# Patient Record
Sex: Female | Born: 1990
Health system: Southern US, Community
[De-identification: ages and names within clinical notes are randomized; demographics above are authoritative.]

## PROBLEM LIST (undated history)

## (undated) ENCOUNTER — Emergency Department (HOSPITAL_BASED_OUTPATIENT_CLINIC_OR_DEPARTMENT_OTHER): Admission: EM | Payer: No Typology Code available for payment source

---

## 2016-06-16 ENCOUNTER — Emergency Department (HOSPITAL_BASED_OUTPATIENT_CLINIC_OR_DEPARTMENT_OTHER)
Admission: EM | Admit: 2016-06-16 | Discharge: 2016-06-16 | Disposition: A | Discharge status: Home / Self Care | Payer: Medicaid Other | Attending: Emergency Medicine | Admitting: Emergency Medicine

## 2016-06-16 ENCOUNTER — Encounter (HOSPITAL_BASED_OUTPATIENT_CLINIC_OR_DEPARTMENT_OTHER): Payer: Self-pay | Admitting: Emergency Medicine

## 2016-06-16 DIAGNOSIS — J029 Acute pharyngitis, unspecified: Secondary | ICD-10-CM | POA: Diagnosis not present

## 2016-06-16 DIAGNOSIS — F17210 Nicotine dependence, cigarettes, uncomplicated: Secondary | ICD-10-CM | POA: Insufficient documentation

## 2016-06-16 DIAGNOSIS — R0981 Nasal congestion: Secondary | ICD-10-CM | POA: Diagnosis present

## 2016-06-16 LAB — RAPID STREP SCREEN (MED CTR MEBANE ONLY): Streptococcus, Group A Screen (Direct): NEGATIVE

## 2016-06-16 MED ORDER — FLUTICASONE PROPIONATE 50 MCG/ACT NA SUSP: 1.0000 | Freq: Every day | NASAL | 0 refills | Status: AC

## 2016-06-16 MED ORDER — CETIRIZINE-PSEUDOEPHEDRINE ER 5-120 MG PO TB12: 1.0000 | ORAL_TABLET | Freq: Every day | ORAL | 0 refills | Status: AC

## 2016-06-16 NOTE — ED Notes (Signed)
Pt given d/c instructions as per chart. Rx x 2. Verbalizes understanding. No questions. 

## 2016-06-16 NOTE — Discharge Instructions (Signed)
Use ibuprofen and Tylenol as needed for pain and fever. Warm salt water gargles, tea, honey, and over-the-counter lozenges or lollipops or sprays can be helpful for your discomfort. You may take Zyrtec and Flonase for your nasal congestion. Follow-up with primary care for reevaluation. Return to the ED if you develop any concerning symptoms.

## 2016-06-16 NOTE — ED Provider Notes (Signed)
[facility] Provider Note   CSN: [id number] Arrival date & time: 06/16/16  1741  By signing my name below, I, Megan Walters, attest that this documentation has been prepared under the direction and in the presence of [facility] PA-C. Electronically Signed: Cynda Walters, Scribe. 06/16/16. 6:55 PM.  History   Chief Complaint Chief Complaint  Patient presents with  . Sore Throat    HPI Comments: Megan Walters is a 26 y.o. female with no apparent medical history, who presents to the Emergency Department complaining of a persistent sore throat that began 3 days ago. Patient states her pain is gradually worsening. Patient states she feels as if she has strep throat. Patient reports associated nasal congestion, poor appetite, trouble swallowing, and a cough. Patient has not tried anything for her symptoms.  No modifying factors indicated. Good fluid intake. Patient denies any fever, chills, nausea, vomiting, constipation, diarrhea, SOB, chest pain, or ear pain.   The history is provided by the patient. No language interpreter was used.    History reviewed. No pertinent past medical history.  There are no active problems to display for this patient.   History reviewed. No pertinent surgical history.  OB History    No data available       Home Medications    Prior to Admission medications   Medication Sig Start Date End Date Taking? Authorizing Provider  cetirizine-pseudoephedrine (ZYRTEC-D) 5-120 MG tablet Take 1 tablet by mouth daily. 06/16/16 06/26/16  Trejuan Matherne A Lorraine Terriquez, PA-C  fluticasone (FLONASE) 50 MCG/ACT nasal spray Place 1 spray into both nostrils daily. 06/16/16   Jeanie Sewer, PA-C    Family History History reviewed. No pertinent family history.  Social History Social History  Substance Use Topics  . Smoking status: Current Every Day Smoker    Packs/day: 0.50    Types: Cigarettes  . Smokeless tobacco: Never Used  . Alcohol use No     Allergies   Patient  has no known allergies.   Review of Systems Review of Systems  Constitutional: Negative for chills and fever.  HENT: Positive for congestion, sore throat and trouble swallowing. Negative for ear pain.   Respiratory: Positive for cough. Negative for shortness of breath.   Cardiovascular: Negative for chest pain.  Gastrointestinal: Negative for constipation, diarrhea, nausea and vomiting.  All other systems reviewed and are negative.    Physical Exam Updated Vital Signs BP 126/81 (BP Location: Right Arm)   Pulse 85   Temp 99.5 F (37.5 C) (Oral)   Resp 16   Ht  (1.626 m)   Wt 72.6 kg   LMP 06/13/2016 (Exact Date)   SpO2 100%   BMI 27.46 kg/m   Physical Exam  Constitutional: She is oriented to person, place, and time. She appears well-developed and well-nourished.  HENT:  Head: Normocephalic and atraumatic.  Right Ear: External ear normal.  Left Ear: External ear normal.  Mouth/Throat: No oropharyngeal exudate.  TMs normal bilaterally. Nasal septum is midline with pink nasal mucosa and yellow-white nasal discharge. No TTP of maxillary or frontal sinuses. Posterior oropharynx with erythema and tonsillar hypertrophy; no exudates or uvular deviation.   Eyes: Conjunctivae and EOM are normal. Pupils are equal, round, and reactive to light. Right eye exhibits no discharge. Left eye exhibits no discharge. No scleral icterus.  Neck: Normal range of motion. Neck supple. No JVD present. No tracheal deviation present. No thyromegaly present.  Bilateral anterior cervical LAD  Cardiovascular: Normal rate, regular rhythm, normal heart  sounds and intact distal pulses.   Pulmonary/Chest: Effort normal and breath sounds normal.  Abdominal: Soft. She exhibits no distension. There is no tenderness.  Musculoskeletal: Normal range of motion. She exhibits no edema.  Lymphadenopathy:    She has cervical adenopathy.  Neurological: She is alert and oriented to person, place, and time.  Skin:  Skin is warm and dry. She is not diaphoretic.  Psychiatric: She has a normal mood and affect. Her behavior is normal.  Nursing note and vitals reviewed.    ED Treatments / Results  DIAGNOSTIC STUDIES: Oxygen Saturation is 100% on RA, normal by my interpretation.    COORDINATION OF CARE: 6:55 PM Discussed treatment plan with pt at bedside and pt agreed to plan, which includes tylenol and ibuprofen.   Labs (all labs ordered are listed, but only abnormal results are displayed) Labs Reviewed  RAPID STREP SCREEN (NOT AT [facility])  CULTURE, GROUP A STREP [facility])    EKG  EKG Interpretation None       Radiology No results found.  Procedures Procedures (including critical care time)  Medications Ordered in ED Medications - No data to display   Initial Impression / Assessment and Plan / ED Course  I have reviewed the triage vital signs and the nursing notes.  Pertinent labs & imaging results that were available during my care of the patient were reviewed by me and considered in my medical decision making (see chart for details).     Patient with sore throat and nasal congestion for 3 days. Patient afebrile, vital signs stable. Rapid strep negative. Sent for culture. Likely viral pharyngitis due to presence of cough and lack of exudates. Presentation non concerning for PTA or infxn spread to soft tissue. No trismus or uvula deviation. Discussed use of NSAIDs or Tylenol for comfort and if fever develops. Recommend warm salt water gargles, hot tea, honey, and over-the-counter lozenges or sprays. Antihistamine and intranasal corticosteroid for congestion. Rx for the aforementioned given. Specific return precautions discussed. Pt able to drink water in ED without difficulty with intact air way. Recommended PCP follow up. Pt verbalized understanding of and agreement with plan and is safe for discharge home at this time.   Final Clinical Impressions(s) / ED Diagnoses   Final diagnoses:    Acute pharyngitis, unspecified etiology  Mild nasal congestion    New Prescriptions Discharge Medication List as of 06/16/2016  7:01 PM    START taking these medications   Details  cetirizine-pseudoephedrine (ZYRTEC-D) 5-120 MG tablet Take 1 tablet by mouth daily., Starting Sun 06/16/2016, Until Wed 06/26/2016, Print    fluticasone (FLONASE) 50 MCG/ACT nasal spray Place 1 spray into both nostrils daily., Starting Sun 06/16/2016, Print       I personally performed the services described in this documentation, which was scribed in my presence. The recorded information has been reviewed and is accurate.     Jeanie Sewer, PA-C 06/17/16 1216    Gwyneth Sprout, MD 06/19/16 1109

## 2016-06-16 NOTE — ED Triage Notes (Signed)
Patient reports "I think I have strep throat". Reports pain when swallowing since Friday.  Denies fevers.

## 2016-06-18 LAB — CULTURE, GROUP A STREP (THRC)

## 2017-05-02 ENCOUNTER — Encounter (HOSPITAL_BASED_OUTPATIENT_CLINIC_OR_DEPARTMENT_OTHER): Payer: Self-pay

## 2017-05-02 ENCOUNTER — Other Ambulatory Visit: Payer: Self-pay

## 2017-05-02 DIAGNOSIS — Z5321 Procedure and treatment not carried out due to patient leaving prior to being seen by health care provider: Secondary | ICD-10-CM | POA: Insufficient documentation

## 2017-05-02 DIAGNOSIS — M791 Myalgia, unspecified site: Secondary | ICD-10-CM | POA: Insufficient documentation

## 2017-05-02 NOTE — ED Triage Notes (Addendum)
Pt fell down a flight of stairs yesterday, c/o pain to multiple parts of her body, in obvious pain with trying to sit and change position, took tylenol today without relief

## 2017-05-03 ENCOUNTER — Emergency Department (HOSPITAL_BASED_OUTPATIENT_CLINIC_OR_DEPARTMENT_OTHER)
Admission: EM | Admit: 2017-05-03 | Discharge: 2017-05-03 | Disposition: A | Discharge status: Home / Self Care | Payer: Medicaid Other | Attending: Emergency Medicine | Admitting: Emergency Medicine

## 2017-05-03 NOTE — ED Notes (Signed)
Pt called to treatment room with no answer from lobby.  

## 2017-05-03 NOTE — ED Notes (Signed)
Called to treatment room for 2nd time with no answer from lobby 

## 2017-06-16 ENCOUNTER — Emergency Department (HOSPITAL_BASED_OUTPATIENT_CLINIC_OR_DEPARTMENT_OTHER)
Admission: EM | Admit: 2017-06-16 | Discharge: 2017-06-16 | Disposition: A | Discharge status: Home / Self Care | Payer: No Typology Code available for payment source | Attending: Emergency Medicine | Admitting: Emergency Medicine

## 2017-06-16 ENCOUNTER — Encounter (HOSPITAL_BASED_OUTPATIENT_CLINIC_OR_DEPARTMENT_OTHER): Payer: Self-pay

## 2017-06-16 ENCOUNTER — Other Ambulatory Visit: Payer: Self-pay

## 2017-06-16 DIAGNOSIS — Y9389 Activity, other specified: Secondary | ICD-10-CM | POA: Insufficient documentation

## 2017-06-16 DIAGNOSIS — F17210 Nicotine dependence, cigarettes, uncomplicated: Secondary | ICD-10-CM | POA: Insufficient documentation

## 2017-06-16 DIAGNOSIS — S161XXA Strain of muscle, fascia and tendon at neck level, initial encounter: Secondary | ICD-10-CM | POA: Diagnosis not present

## 2017-06-16 DIAGNOSIS — S199XXA Unspecified injury of neck, initial encounter: Secondary | ICD-10-CM | POA: Diagnosis present

## 2017-06-16 DIAGNOSIS — S39012A Strain of muscle, fascia and tendon of lower back, initial encounter: Secondary | ICD-10-CM | POA: Insufficient documentation

## 2017-06-16 DIAGNOSIS — Y999 Unspecified external cause status: Secondary | ICD-10-CM | POA: Diagnosis not present

## 2017-06-16 DIAGNOSIS — Y92410 Unspecified street and highway as the place of occurrence of the external cause: Secondary | ICD-10-CM | POA: Diagnosis not present

## 2017-06-16 MED ORDER — TRAMADOL HCL 50 MG PO TABS
50.0000 mg | ORAL_TABLET | Freq: Once | ORAL | Status: AC
  Administered 2017-06-16: 50 mg via ORAL
  Filled 2017-06-16: qty 1

## 2017-06-16 MED ORDER — METHOCARBAMOL 500 MG PO TABS: 1000.0000 mg | ORAL_TABLET | Freq: Four times a day (QID) | ORAL | 0 refills | Status: DC

## 2017-06-16 MED ORDER — METHOCARBAMOL 500 MG PO TABS
1000.0000 mg | ORAL_TABLET | Freq: Once | ORAL | Status: AC
  Administered 2017-06-16: 1000 mg via ORAL
  Filled 2017-06-16: qty 2
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
## 2017-06-16 NOTE — ED Notes (Signed)
Patient educated about not driving or performing other critical tasks (such as operating heavy machinery, caring for infant/toddler/child) due to sedative nature of narcotic medications received while in the ED.  Pt/caregiver verbalized understanding.   

## 2017-06-16 NOTE — ED Notes (Signed)
Pt verbalizes understanding of d/c instructions and denies any further needs at this time. 

## 2018-04-27 ENCOUNTER — Other Ambulatory Visit: Payer: Self-pay

## 2018-04-27 ENCOUNTER — Encounter (HOSPITAL_BASED_OUTPATIENT_CLINIC_OR_DEPARTMENT_OTHER): Payer: Self-pay | Admitting: *Deleted

## 2018-04-27 ENCOUNTER — Emergency Department (HOSPITAL_BASED_OUTPATIENT_CLINIC_OR_DEPARTMENT_OTHER): Payer: Medicaid Other

## 2018-04-27 ENCOUNTER — Emergency Department (HOSPITAL_BASED_OUTPATIENT_CLINIC_OR_DEPARTMENT_OTHER)
Admission: EM | Admit: 2018-04-27 | Discharge: 2018-04-27 | Disposition: A | Discharge status: Home / Self Care | Payer: Medicaid Other | Attending: Emergency Medicine | Admitting: Emergency Medicine

## 2018-04-27 DIAGNOSIS — Z3202 Encounter for pregnancy test, result negative: Secondary | ICD-10-CM | POA: Diagnosis not present

## 2018-04-27 DIAGNOSIS — Y92410 Unspecified street and highway as the place of occurrence of the external cause: Secondary | ICD-10-CM | POA: Diagnosis not present

## 2018-04-27 DIAGNOSIS — Y9389 Activity, other specified: Secondary | ICD-10-CM | POA: Insufficient documentation

## 2018-04-27 DIAGNOSIS — S199XXA Unspecified injury of neck, initial encounter: Secondary | ICD-10-CM | POA: Diagnosis not present

## 2018-04-27 DIAGNOSIS — S0990XA Unspecified injury of head, initial encounter: Secondary | ICD-10-CM | POA: Diagnosis present

## 2018-04-27 DIAGNOSIS — M25571 Pain in right ankle and joints of right foot: Secondary | ICD-10-CM | POA: Insufficient documentation

## 2018-04-27 DIAGNOSIS — F17210 Nicotine dependence, cigarettes, uncomplicated: Secondary | ICD-10-CM | POA: Insufficient documentation

## 2018-04-27 DIAGNOSIS — Y999 Unspecified external cause status: Secondary | ICD-10-CM | POA: Diagnosis not present

## 2018-04-27 DIAGNOSIS — S0993XA Unspecified injury of face, initial encounter: Secondary | ICD-10-CM | POA: Insufficient documentation

## 2018-04-27 LAB — PREGNANCY, URINE: PREG TEST UR: NEGATIVE

## 2018-04-27 MED ORDER — METHOCARBAMOL 500 MG PO TABS: 500.0000 mg | ORAL_TABLET | Freq: Three times a day (TID) | ORAL | 0 refills | Status: DC | PRN

## 2018-04-27 MED ORDER — KETOROLAC TROMETHAMINE 30 MG/ML IJ SOLN
30.0000 mg | Freq: Once | INTRAMUSCULAR | Status: DC
  Filled 2018-04-27: qty 1

## 2018-04-27 MED ORDER — ACETAMINOPHEN 500 MG PO TABS
1000.0000 mg | ORAL_TABLET | Freq: Once | ORAL | Status: DC
  Filled 2018-04-27: qty 2

## 2018-04-27 MED FILL — METHOCARBAMOL 500 MG TABLET: 500 | 7 days supply | Qty: 20 | Fill #0
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
## 2018-04-27 NOTE — Discharge Instructions (Signed)
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
## 2018-09-06 ENCOUNTER — Encounter (HOSPITAL_BASED_OUTPATIENT_CLINIC_OR_DEPARTMENT_OTHER): Payer: Self-pay

## 2018-09-06 ENCOUNTER — Emergency Department (HOSPITAL_BASED_OUTPATIENT_CLINIC_OR_DEPARTMENT_OTHER)
Admission: EM | Admit: 2018-09-06 | Discharge: 2018-09-07 | Disposition: A | Discharge status: Home / Self Care | Payer: Medicaid Other | Attending: Emergency Medicine | Admitting: Emergency Medicine

## 2018-09-06 ENCOUNTER — Other Ambulatory Visit: Payer: Self-pay

## 2018-09-06 DIAGNOSIS — E876 Hypokalemia: Secondary | ICD-10-CM | POA: Diagnosis not present

## 2018-09-06 DIAGNOSIS — K8020 Calculus of gallbladder without cholecystitis without obstruction: Secondary | ICD-10-CM | POA: Diagnosis not present

## 2018-09-06 DIAGNOSIS — R1032 Left lower quadrant pain: Secondary | ICD-10-CM | POA: Insufficient documentation

## 2018-09-06 DIAGNOSIS — Z79899 Other long term (current) drug therapy: Secondary | ICD-10-CM | POA: Insufficient documentation

## 2018-09-06 DIAGNOSIS — K529 Noninfective gastroenteritis and colitis, unspecified: Secondary | ICD-10-CM | POA: Insufficient documentation

## 2018-09-06 DIAGNOSIS — R1012 Left upper quadrant pain: Secondary | ICD-10-CM | POA: Insufficient documentation

## 2018-09-06 DIAGNOSIS — R109 Unspecified abdominal pain: Secondary | ICD-10-CM

## 2018-09-06 DIAGNOSIS — F17210 Nicotine dependence, cigarettes, uncomplicated: Secondary | ICD-10-CM | POA: Insufficient documentation

## 2018-09-06 LAB — CBC
HCT: 42 % (ref 36.0–46.0)
Hemoglobin: 13.9 g/dL (ref 12.0–15.0)
MCH: 30.7 pg (ref 26.0–34.0)
MCHC: 33.1 g/dL (ref 30.0–36.0)
MCV: 92.7 fL (ref 80.0–100.0)
Platelets: 216 10*3/uL (ref 150–400)
RBC: 4.53 MIL/uL (ref 3.87–5.11)
RDW: 13.3 % (ref 11.5–15.5)
WBC: 5.6 10*3/uL (ref 4.0–10.5)
nRBC: 0 % (ref 0.0–0.2)

## 2018-09-06 LAB — URINALYSIS, ROUTINE W REFLEX MICROSCOPIC
Glucose, UA: NEGATIVE mg/dL
Ketones, ur: 15 mg/dL — AB
Leukocytes,Ua: NEGATIVE
Nitrite: NEGATIVE
Protein, ur: 100 mg/dL — AB
Specific Gravity, Urine: 1.02 (ref 1.005–1.030)
pH: 6.5 (ref 5.0–8.0)

## 2018-09-06 LAB — URINALYSIS, MICROSCOPIC (REFLEX)

## 2018-09-06 LAB — PREGNANCY, URINE: Preg Test, Ur: NEGATIVE

## 2018-09-06 MED ORDER — MORPHINE SULFATE (PF) 4 MG/ML IV SOLN
4.0000 mg | Freq: Once | INTRAVENOUS | Status: AC
  Administered 2018-09-06: 4 mg via INTRAVENOUS
  Filled 2018-09-06: qty 1

## 2018-09-06 MED ORDER — IOHEXOL 300 MG/ML  SOLN
100.0000 mL | Freq: Once | INTRAMUSCULAR | Status: AC
  Administered 2018-09-07: 100 mL via INTRAVENOUS

## 2018-09-06 MED ORDER — SODIUM CHLORIDE 0.9 % IV BOLUS
1000.0000 mL | Freq: Once | INTRAVENOUS | Status: AC
  Administered 2018-09-06: 1000 mL via INTRAVENOUS

## 2018-09-06 MED ORDER — ONDANSETRON HCL 4 MG/2ML IJ SOLN
4.0000 mg | Freq: Once | INTRAMUSCULAR | Status: AC
  Administered 2018-09-06: 4 mg via INTRAVENOUS
  Filled 2018-09-06: qty 2

## 2018-09-06 NOTE — ED Provider Notes (Signed)
[facility] EMERGENCY DEPARTMENT Provider Note   CSN: [id number] Arrival date & time: 09/06/18  2210     History   Chief Complaint Chief Complaint  Patient presents with   Abdominal Pain    HPI Megan Walters is a 28 y.o. female.     Patient c/o left abd pain for the past 3-4 days. Symptoms acute onset, mod-severe, constant, persistent, non radiating, without specific exacerbating or allev factors. No hx same pain. No hematuria or dysuria. No vaginal bleeding or discharge. lnmp 1 week ago. Denies hx ovarian cyst or endometriosis. No constipation. No nvd. Back or posterior flank pain. Normal appetite. No fever or chills. No known covid + exposure. Denies cough or sob. No chest pain.   The history is provided by the patient.  Abdominal Pain Associated symptoms: no chest pain, no chills, no constipation, no diarrhea, no dysuria, no fever, no shortness of breath, no sore throat, no vaginal bleeding, no vaginal discharge and no vomiting     History reviewed. No pertinent past medical history.  There are no active problems to display for this patient.   History reviewed. No pertinent surgical history.   OB History   No obstetric history on file.      Home Medications    Prior to Admission medications   Medication Sig Start Date End Date Taking? Authorizing Provider  fluticasone (FLONASE) 50 MCG/ACT nasal spray Place 1 spray into both nostrils daily. 06/16/16   Fawze, Mina A, PA-C  methocarbamol (ROBAXIN) 500 MG tablet Take 1 tablet (500 mg total) by mouth every 8 (eight) hours as needed for muscle spasms. 04/27/18   Long, Arlyss RepressJoshua G, MD    Family History No family history on file.  Social History Social History   Tobacco Use   Smoking status: Current Every Day Smoker    Packs/day: 0.50    Types: Cigarettes   Smokeless tobacco: Never Used  Substance Use Topics   Alcohol use: Yes   Drug use: No     Allergies   Ibuprofen   Review of Systems Review  of Systems  Constitutional: Negative for chills and fever.  HENT: Negative for sore throat.   Eyes: Negative for redness.  Respiratory: Negative for shortness of breath.   Cardiovascular: Negative for chest pain.  Gastrointestinal: Positive for abdominal pain. Negative for constipation, diarrhea and vomiting.  Endocrine: Negative for polyuria.  Genitourinary: Negative for dysuria, flank pain, vaginal bleeding and vaginal discharge.  Musculoskeletal: Negative for back pain and neck pain.  Skin: Negative for rash.  Neurological: Negative for headaches.  Hematological: Does not bruise/bleed easily.  Psychiatric/Behavioral: Negative for confusion.     Physical Exam Updated Vital Signs BP 110/83 (BP Location: Left Arm)    Pulse 76    Temp 98.3 F (36.8 C) (Oral)    Resp 16    Ht 1.626 m (5\' 4" )    Wt 77.6 kg    LMP 08/29/2018 (LMP Unknown)    SpO2 100%    BMI 29.35 kg/m   Physical Exam Vitals signs and nursing note reviewed.  Constitutional:      Appearance: Normal appearance. She is well-developed.  HENT:     Head: Atraumatic.     Nose: Nose normal.     Mouth/Throat:     Mouth: Mucous membranes are moist.  Eyes:     General: No scleral icterus.    Conjunctiva/sclera: Conjunctivae normal.  Neck:     Musculoskeletal: Normal range of motion and neck supple.  No neck rigidity or muscular tenderness.     Trachea: No tracheal deviation.  Cardiovascular:     Rate and Rhythm: Normal rate and regular rhythm.     Pulses: Normal pulses.     Heart sounds: Normal heart sounds. No murmur. No friction rub. No gallop.   Pulmonary:     Effort: Pulmonary effort is normal. No respiratory distress.     Breath sounds: Normal breath sounds.  Abdominal:     General: Bowel sounds are normal. There is no distension.     Palpations: Abdomen is soft. There is no mass.     Tenderness: There is abdominal tenderness. There is no guarding or rebound.     Hernia: No hernia is present.     Comments:  Moderate left abd tenderness.   Genitourinary:    Comments: No cva tenderness. Mild whitish vaginal discharge. Mild cmt. No adx masses/focal tenderness.  Musculoskeletal:        General: No swelling.  Skin:    General: Skin is warm and dry.     Findings: No rash.  Neurological:     Mental Status: She is alert.     Comments: Alert, speech normal.   Psychiatric:        Mood and Affect: Mood normal.      ED Treatments / Results  Labs (all labs ordered are listed, but only abnormal results are displayed) Results for orders placed or performed during the hospital encounter of 09/06/18  Wet prep, genital   Specimen: Vaginal; Genital  Result Value Ref Range   Yeast Wet Prep HPF POC NONE SEEN NONE SEEN   Trich, Wet Prep NONE SEEN NONE SEEN   Clue Cells Wet Prep HPF POC PRESENT (A) NONE SEEN   WBC, Wet Prep HPF POC MODERATE (A) NONE SEEN   Sperm NONE SEEN   Pregnancy, urine  Result Value Ref Range   Preg Test, Ur NEGATIVE NEGATIVE  Urinalysis, Routine w reflex microscopic  Result Value Ref Range   Color, Urine YELLOW YELLOW   APPearance HAZY (A) CLEAR   Specific Gravity, Urine 1.020 1.005 - 1.030   pH 6.5 5.0 - 8.0   Glucose, UA NEGATIVE NEGATIVE mg/dL   Hgb urine dipstick TRACE (A) NEGATIVE   Bilirubin Urine SMALL (A) NEGATIVE   Ketones, ur 15 (A) NEGATIVE mg/dL   Protein, ur 960100 (A) NEGATIVE mg/dL   Nitrite NEGATIVE NEGATIVE   Leukocytes,Ua NEGATIVE NEGATIVE  Urinalysis, Microscopic (reflex)  Result Value Ref Range   RBC / HPF 0-5 0 - 5 RBC/hpf   WBC, UA 0-5 0 - 5 WBC/hpf   Bacteria, UA FEW (A) NONE SEEN   Squamous Epithelial / LPF 0-5 0 - 5  Comprehensive metabolic panel  Result Value Ref Range   Sodium 137 135 - 145 mmol/L   Potassium 2.8 (L) 3.5 - 5.1 mmol/L   Chloride 99 98 - 111 mmol/L   CO2 24 22 - 32 mmol/L   Glucose, Bld 87 70 - 99 mg/dL   BUN 6 6 - 20 mg/dL   Creatinine, Ser 4.540.57 0.44 - 1.00 mg/dL   Calcium 9.1 8.9 - 09.810.3 mg/dL   Total Protein 8.1 6.5 -  8.1 g/dL   Albumin 3.7 3.5 - 5.0 g/dL   AST 29 15 - 41 U/L   ALT 19 0 - 44 U/L   Alkaline Phosphatase 59 38 - 126 U/L   Total Bilirubin 0.8 0.3 - 1.2 mg/dL   GFR calc non Af Amer >  60 >60 mL/min   GFR calc Af Amer >60 >60 mL/min   Anion gap 14 5 - 15  CBC  Result Value Ref Range   WBC 5.6 4.0 - 10.5 K/uL   RBC 4.53 3.87 - 5.11 MIL/uL   Hemoglobin 13.9 12.0 - 15.0 g/dL   HCT 69.642.0 29.536.0 - 28.446.0 %   MCV 92.7 80.0 - 100.0 fL   MCH 30.7 26.0 - 34.0 pg   MCHC 33.1 30.0 - 36.0 g/dL   RDW 13.213.3 44.011.5 - 10.215.5 %   Platelets 216 150 - 400 K/uL   nRBC 0.0 0.0 - 0.2 %  Lipase, blood  Result Value Ref Range   Lipase 21 11 - 51 U/L   Ct Abdomen Pelvis W Contrast  Result Date: 09/07/2018 CLINICAL DATA:  28 year old female with generalized abdominal pain. EXAM: CT ABDOMEN AND PELVIS WITH CONTRAST TECHNIQUE: Multidetector CT imaging of the abdomen and pelvis was performed using the standard protocol following bolus administration of intravenous contrast. CONTRAST:  100mL OMNIPAQUE IOHEXOL 300 MG/ML  SOLN COMPARISON:  None. FINDINGS: Lower chest: The visualized lung bases are clear. No intra-abdominal free air or free fluid. Hepatobiliary: Apparent diffuse fatty infiltration of the liver. No intrahepatic biliary ductal dilatation. Noncalcified stones containing gas noted in the gallbladder. No pericholecystic fluid. Ultrasound may provide better evaluation of the gallbladder if clinically indicated. Pancreas: Unremarkable. No pancreatic ductal dilatation or surrounding inflammatory changes. Spleen: Normal in size without focal abnormality. Adrenals/Urinary Tract: Adrenal glands are unremarkable. Kidneys are normal, without renal calculi, focal lesion, or hydronephrosis. Bladder is unremarkable. Stomach/Bowel: There is diffuse thickened appearance of the small bowel loops with edema in the small bowel mesentery most consistent with enteritis. Clinical correlation is recommended. There is no bowel obstruction. Loose  stool noted throughout the colon compatible with diarrheal state. The appendix is normal. Vascular/Lymphatic: The abdominal aorta and IVC are unremarkable. No portal venous gas. There is no adenopathy. Reproductive: The uterus is anteverted and grossly unremarkable. No adnexal masses identified. Other: None Musculoskeletal: No acute or significant osseous findings. IMPRESSION: 1. Enteritis with diarrheal state. Correlation with clinical exam and stool cultures recommended. No bowel obstruction. Normal appendix. 2. Fatty liver. 3. Cholelithiasis. Ultrasound may provide better evaluation of the gallbladder if clinically indicated. Electronically Signed   By: Elgie CollardArash  Radparvar M.D.   On: 09/07/2018 00:54    EKG None  Radiology Ct Abdomen Pelvis W Contrast  Result Date: 09/07/2018 CLINICAL DATA:  28 year old female with generalized abdominal pain. EXAM: CT ABDOMEN AND PELVIS WITH CONTRAST TECHNIQUE: Multidetector CT imaging of the abdomen and pelvis was performed using the standard protocol following bolus administration of intravenous contrast. CONTRAST:  100mL OMNIPAQUE IOHEXOL 300 MG/ML  SOLN COMPARISON:  None. FINDINGS: Lower chest: The visualized lung bases are clear. No intra-abdominal free air or free fluid. Hepatobiliary: Apparent diffuse fatty infiltration of the liver. No intrahepatic biliary ductal dilatation. Noncalcified stones containing gas noted in the gallbladder. No pericholecystic fluid. Ultrasound may provide better evaluation of the gallbladder if clinically indicated. Pancreas: Unremarkable. No pancreatic ductal dilatation or surrounding inflammatory changes. Spleen: Normal in size without focal abnormality. Adrenals/Urinary Tract: Adrenal glands are unremarkable. Kidneys are normal, without renal calculi, focal lesion, or hydronephrosis. Bladder is unremarkable. Stomach/Bowel: There is diffuse thickened appearance of the small bowel loops with edema in the small bowel mesentery most  consistent with enteritis. Clinical correlation is recommended. There is no bowel obstruction. Loose stool noted throughout the colon compatible with diarrheal state. The appendix is normal.  Vascular/Lymphatic: The abdominal aorta and IVC are unremarkable. No portal venous gas. There is no adenopathy. Reproductive: The uterus is anteverted and grossly unremarkable. No adnexal masses identified. Other: None Musculoskeletal: No acute or significant osseous findings. IMPRESSION: 1. Enteritis with diarrheal state. Correlation with clinical exam and stool cultures recommended. No bowel obstruction. Normal appendix. 2. Fatty liver. 3. Cholelithiasis. Ultrasound may provide better evaluation of the gallbladder if clinically indicated. Electronically Signed   By: Anner Crete M.D.   On: 09/07/2018 00:54    Procedures Procedures (including critical care time)  Medications Ordered in ED Medications  sodium chloride 0.9 % bolus 1,000 mL (has no administration in time range)  morphine 4 MG/ML injection 4 mg (has no administration in time range)  ondansetron (ZOFRAN) injection 4 mg (has no administration in time range)     Initial Impression / Assessment and Plan / ED Course  I have reviewed the triage vital signs and the nursing notes.  Pertinent labs & imaging results that were available during my care of the patient were reviewed by me and considered in my medical decision making (see chart for details).  Iv ns bolus. Morphine iv. zofran iv. Labs sent.  Labs reviewed by me - preg tet neg.   CT pending.   Reviewed nursing notes and prior charts for additional history.   CT reviewed by me - gallstones, enteritis. Note that pt denies any diarrhea. Denies hx chronic or recurrent abd pain, no hx IBD. pts pain is predominantly left sided/lower - current symptoms felt not c/w acute biliary colic.  Pt requests additional pain meds - provided.   Given ct c/w 'enteritis', pain/tenderness, will tx w  abx therapy, and symptom relief/pain medication.   rx abx for home.   rec pcp f/u.  Return precautions provided.     Final Clinical Impressions(s) / ED Diagnoses   Final diagnoses:  None    ED Discharge Orders    None       Lajean Saver, MD 09/07/18 [phone]

## 2018-09-06 NOTE — ED Triage Notes (Signed)
L side abd apin x 4 days. Denies N/V/D and has normal appetite.

## 2018-09-07 ENCOUNTER — Emergency Department (HOSPITAL_BASED_OUTPATIENT_CLINIC_OR_DEPARTMENT_OTHER): Payer: Medicaid Other

## 2018-09-07 LAB — COMPREHENSIVE METABOLIC PANEL
ALT: 19 U/L (ref 0–44)
AST: 29 U/L (ref 15–41)
Albumin: 3.7 g/dL (ref 3.5–5.0)
Alkaline Phosphatase: 59 U/L (ref 38–126)
Anion gap: 14 (ref 5–15)
BUN: 6 mg/dL (ref 6–20)
CO2: 24 mmol/L (ref 22–32)
Calcium: 9.1 mg/dL (ref 8.9–10.3)
Chloride: 99 mmol/L (ref 98–111)
Creatinine, Ser: 0.57 mg/dL (ref 0.44–1.00)
GFR calc Af Amer: >60 mL/min (ref >60)
GFR calc non Af Amer: >60 mL/min (ref >60)
Glucose, Bld: 87 mg/dL (ref 70–99)
Potassium: 2.8 mmol/L — ABNORMAL LOW (ref 3.5–5.1)
Sodium: 137 mmol/L (ref 135–145)
Total Bilirubin: 0.8 mg/dL (ref 0.3–1.2)
Total Protein: 8.1 g/dL (ref 6.5–8.1)

## 2018-09-07 LAB — WET PREP, GENITAL
Sperm: NONE SEEN
Trich, Wet Prep: NONE SEEN
Yeast Wet Prep HPF POC: NONE SEEN

## 2018-09-07 LAB — LIPASE, BLOOD: Lipase: 21 U/L (ref 11–51)

## 2018-09-07 MED ORDER — MORPHINE SULFATE (PF) 4 MG/ML IV SOLN
4.0000 mg | Freq: Once | INTRAVENOUS | Status: AC
  Administered 2018-09-07: 02:00:00 4 mg via INTRAVENOUS
  Filled 2018-09-07: qty 1

## 2018-09-07 MED ORDER — PIPERACILLIN-TAZOBACTAM 3.375 G IVPB 30 MIN
3.3750 g | Freq: Once | INTRAVENOUS | Status: AC
  Administered 2018-09-07: 02:00:00 3.375 g via INTRAVENOUS
  Filled 2018-09-07 (×2): qty 50

## 2018-09-07 MED ORDER — POTASSIUM CHLORIDE CRYS ER 20 MEQ PO TBCR
40.0000 meq | EXTENDED_RELEASE_TABLET | Freq: Once | ORAL | Status: AC
  Administered 2018-09-07: 01:00:00 40 meq via ORAL
  Filled 2018-09-07: qty 2

## 2018-09-07 MED ORDER — TRAMADOL HCL 50 MG PO TABS: 50.0000 mg | ORAL_TABLET | Freq: Four times a day (QID) | ORAL | 0 refills | Status: DC | PRN

## 2018-09-07 MED ORDER — HYDROMORPHONE HCL 1 MG/ML IJ SOLN
0.5000 mg | Freq: Once | INTRAMUSCULAR | Status: AC
  Administered 2018-09-07: 0.5 mg via INTRAVENOUS
  Filled 2018-09-07: qty 1

## 2018-09-07 MED ORDER — METRONIDAZOLE 500 MG PO TABS: 500.0000 mg | ORAL_TABLET | Freq: Two times a day (BID) | ORAL | 0 refills | Status: DC

## 2018-09-07 MED ORDER — AZITHROMYCIN 250 MG PO TABS
1000.0000 mg | ORAL_TABLET | Freq: Once | ORAL | Status: AC
  Administered 2018-09-07: 1000 mg via ORAL
  Filled 2018-09-07: qty 4

## 2018-09-07 MED ORDER — DOXYCYCLINE HYCLATE 100 MG PO CAPS: 100.0000 mg | ORAL_CAPSULE | Freq: Two times a day (BID) | ORAL | 0 refills | Status: DC

## 2018-09-07 MED ORDER — POTASSIUM CHLORIDE CRYS ER 20 MEQ PO TBCR: EXTENDED_RELEASE_TABLET | ORAL | 0 refills | Status: AC

## 2018-09-07 MED ORDER — POTASSIUM CHLORIDE 10 MEQ/100ML IV SOLN
10.0000 meq | INTRAVENOUS | Status: AC
  Administered 2018-09-07 (×2): 10 meq via INTRAVENOUS
  Filled 2018-09-07 (×2): qty 100

## 2018-09-07 MED ORDER — SODIUM CHLORIDE 0.9 % IV BOLUS
1000.0000 mL | Freq: Once | INTRAVENOUS | Status: AC
  Administered 2018-09-07: 1000 mL via INTRAVENOUS

## 2018-09-07 NOTE — ED Notes (Signed)
Pt understood dc material. NAD noted. All questions answered to satisfaction. Scripts sent in electronically. Pt escorted to check out counter.

## 2018-09-07 NOTE — Discharge Instructions (Addendum)
It was our pleasure to provide your ER care today - we hope that you feel better.  Take antibiotics (doxycycline and flagyl) as prescribed. Do not drink alcohol when taking these antibiotics. Take acetaminophen as need for pain. You may also take ultram as need for pain - no driving when taking.   From today's lab tests, your potassium level is low (2.8) - eat plenty of fruits and vegetables, take potassium supplement as prescribed, and follow up with primary care doctor in 1 week.   Your ct scan was read as follows: 1. Enteritis. No bowel obstruction. Normal appendix. 2. Fatty liver. 3. Gallstones  Follow up with primary care doctor in 1 week - have them review your ct scan.  For gallstones, follow up with general surgeon in the next few weeks - call office to arrange appointment.   Return to ER if worse, new symptoms, fevers, persistent vomiting, worsening or severe pain, other concern.  You were given pain medication in the ER - no driving for the next 6 hours.

## 2018-09-08 LAB — GC/CHLAMYDIA PROBE AMP (~~LOC~~) NOT AT ARMC
Chlamydia: UNDETERMINED
Neisseria Gonorrhea: NEGATIVE

## 2019-01-24 ENCOUNTER — Emergency Department (HOSPITAL_BASED_OUTPATIENT_CLINIC_OR_DEPARTMENT_OTHER)
Admission: EM | Admit: 2019-01-24 | Discharge: 2019-01-24 | Disposition: A | Discharge status: Home / Self Care | Payer: Medicaid Other | Attending: Emergency Medicine | Admitting: Emergency Medicine

## 2019-01-24 ENCOUNTER — Emergency Department (HOSPITAL_BASED_OUTPATIENT_CLINIC_OR_DEPARTMENT_OTHER): Payer: Medicaid Other

## 2019-01-24 ENCOUNTER — Encounter (HOSPITAL_BASED_OUTPATIENT_CLINIC_OR_DEPARTMENT_OTHER): Payer: Self-pay | Admitting: *Deleted

## 2019-01-24 ENCOUNTER — Other Ambulatory Visit: Payer: Self-pay

## 2019-01-24 DIAGNOSIS — R1032 Left lower quadrant pain: Secondary | ICD-10-CM | POA: Insufficient documentation

## 2019-01-24 DIAGNOSIS — R1030 Lower abdominal pain, unspecified: Secondary | ICD-10-CM

## 2019-01-24 DIAGNOSIS — Z79899 Other long term (current) drug therapy: Secondary | ICD-10-CM | POA: Insufficient documentation

## 2019-01-24 DIAGNOSIS — Z886 Allergy status to analgesic agent status: Secondary | ICD-10-CM | POA: Diagnosis not present

## 2019-01-24 DIAGNOSIS — K529 Noninfective gastroenteritis and colitis, unspecified: Secondary | ICD-10-CM | POA: Diagnosis not present

## 2019-01-24 DIAGNOSIS — F17210 Nicotine dependence, cigarettes, uncomplicated: Secondary | ICD-10-CM | POA: Diagnosis not present

## 2019-01-24 DIAGNOSIS — R102 Pelvic and perineal pain: Secondary | ICD-10-CM

## 2019-01-24 DIAGNOSIS — R1031 Right lower quadrant pain: Secondary | ICD-10-CM | POA: Diagnosis present

## 2019-01-24 LAB — COMPREHENSIVE METABOLIC PANEL
ALT: 26 U/L (ref 0–44)
AST: 27 U/L (ref 15–41)
Albumin: 3.8 g/dL (ref 3.5–5.0)
Alkaline Phosphatase: 67 U/L (ref 38–126)
Anion gap: 8 (ref 5–15)
BUN: 10 mg/dL (ref 6–20)
CO2: 24 mmol/L (ref 22–32)
Calcium: 8.8 mg/dL — ABNORMAL LOW (ref 8.9–10.3)
Chloride: 105 mmol/L (ref 98–111)
Creatinine, Ser: 0.69 mg/dL (ref 0.44–1.00)
GFR calc Af Amer: >60 mL/min (ref >60)
GFR calc non Af Amer: >60 mL/min (ref >60)
Glucose, Bld: 92 mg/dL (ref 70–99)
Potassium: 3.7 mmol/L (ref 3.5–5.1)
Sodium: 137 mmol/L (ref 135–145)
Total Bilirubin: 0.8 mg/dL (ref 0.3–1.2)
Total Protein: 7.5 g/dL (ref 6.5–8.1)

## 2019-01-24 LAB — CBC WITH DIFFERENTIAL/PLATELET
Abs Immature Granulocytes: 0 10*3/uL (ref 0.00–0.07)
Basophils Absolute: 0 10*3/uL (ref 0.0–0.1)
Basophils Relative: 0 %
Eosinophils Absolute: 0 10*3/uL (ref 0.0–0.5)
Eosinophils Relative: 0 %
HCT: 43.9 % (ref 36.0–46.0)
Hemoglobin: 14.5 g/dL (ref 12.0–15.0)
Immature Granulocytes: 0 %
Lymphocytes Relative: 62 %
Lymphs Abs: 2.9 10*3/uL (ref 0.7–4.0)
MCH: 30.5 pg (ref 26.0–34.0)
MCHC: 33 g/dL (ref 30.0–36.0)
MCV: 92.4 fL (ref 80.0–100.0)
Monocytes Absolute: 0.5 10*3/uL (ref 0.1–1.0)
Monocytes Relative: 10 %
Neutro Abs: 1.3 10*3/uL — ABNORMAL LOW (ref 1.7–7.7)
Neutrophils Relative %: 28 %
Platelets: 183 10*3/uL (ref 150–400)
RBC: 4.75 MIL/uL (ref 3.87–5.11)
RDW: 11.9 % (ref 11.5–15.5)
WBC: 4.6 10*3/uL (ref 4.0–10.5)
nRBC: 0 % (ref 0.0–0.2)

## 2019-01-24 LAB — WET PREP, GENITAL
Clue Cells Wet Prep HPF POC: NONE SEEN
Sperm: NONE SEEN
Trich, Wet Prep: NONE SEEN
Yeast Wet Prep HPF POC: NONE SEEN

## 2019-01-24 LAB — LIPASE, BLOOD: Lipase: 35 U/L (ref 11–51)

## 2019-01-24 LAB — PREGNANCY, URINE: Preg Test, Ur: NEGATIVE

## 2019-01-24 MED ORDER — ONDANSETRON HCL 4 MG/2ML IJ SOLN
4.0000 mg | Freq: Once | INTRAMUSCULAR | Status: AC
  Administered 2019-01-24: 4 mg via INTRAVENOUS
  Filled 2019-01-24: qty 2

## 2019-01-24 MED ORDER — DICYCLOMINE HCL 20 MG PO TABS: 20.0000 mg | ORAL_TABLET | Freq: Two times a day (BID) | ORAL | 0 refills | Status: DC

## 2019-01-24 MED ORDER — CIPROFLOXACIN HCL 500 MG PO TABS: 500.0000 mg | ORAL_TABLET | Freq: Two times a day (BID) | ORAL | 0 refills | Status: DC

## 2019-01-24 MED ORDER — MORPHINE SULFATE (PF) 4 MG/ML IV SOLN
4.0000 mg | Freq: Once | INTRAVENOUS | Status: AC
  Administered 2019-01-24: 4 mg via INTRAVENOUS
  Filled 2019-01-24: qty 1

## 2019-01-24 MED ORDER — HYDROCODONE-ACETAMINOPHEN 5-325 MG PO TABS: 1.0000 | ORAL_TABLET | ORAL | 0 refills | Status: DC | PRN

## 2019-01-24 MED ORDER — DICYCLOMINE HCL 20 MG PO TABS: 20.0000 mg | ORAL_TABLET | Freq: Two times a day (BID) | ORAL | 0 refills | Status: AC

## 2019-01-24 MED ORDER — IOHEXOL 300 MG/ML  SOLN
100.0000 mL | Freq: Once | INTRAMUSCULAR | Status: AC | PRN
  Administered 2019-01-24: 100 mL via INTRAVENOUS

## 2019-01-24 MED ORDER — FENTANYL CITRATE (PF) 100 MCG/2ML IJ SOLN
25.0000 ug | Freq: Once | INTRAMUSCULAR | Status: AC
  Administered 2019-01-24: 22:00:00 25 ug via INTRAVENOUS
  Filled 2019-01-24: qty 2

## 2019-01-24 MED ORDER — SODIUM CHLORIDE 0.9 % IV BOLUS
1000.0000 mL | Freq: Once | INTRAVENOUS | Status: AC
  Administered 2019-01-24: 21:00:00 1000 mL via INTRAVENOUS

## 2019-01-24 MED ORDER — METRONIDAZOLE 500 MG PO TABS: 500.0000 mg | ORAL_TABLET | Freq: Two times a day (BID) | ORAL | 0 refills | Status: DC

## 2019-01-24 MED ORDER — HYDROCODONE-ACETAMINOPHEN 5-325 MG PO TABS: 2.0000 | ORAL_TABLET | ORAL | 0 refills | Status: DC | PRN

## 2019-01-24 MED ORDER — DICYCLOMINE HCL 10 MG PO CAPS
10.0000 mg | ORAL_CAPSULE | Freq: Once | ORAL | Status: AC
  Administered 2019-01-24: 22:00:00 10 mg via ORAL
  Filled 2019-01-24: qty 1

## 2019-01-24 NOTE — Discharge Instructions (Signed)
Take the medications as prescribed.  Follow-up with OB/GYN this week.  Return for new or worsening symptoms.

## 2019-01-24 NOTE — ED Provider Notes (Signed)
[facility] EMERGENCY DEPARTMENT Provider Note   CSN: [id number] Arrival date & time: 01/24/19  1946    History   Chief Complaint Chief Complaint  Patient presents with   Abdominal Pain    HPI Megan Walters is a 28 y.o. female with no significant past medical history who presents for evaluation of of lower abdominal pain.  Patient states she has had diffuse lower abdominal cramping.  This is intermittent.  Located to left and right lower quadrant.  She has been taking Tylenol without relief of her pain.  She denies chance of pregnancy.  LMP 3 weeks ago.  She is not currently sexually active.  She denies fever, chills, nausea, vomiting, chest pain, shortness of breath, dysuria, vaginal discharge, concerns for STDs, dysuria, diarrhea or constipation.  Her last bowel movement was yesterday without melena or bright red blood per rectum.  No known Covid exposures.  Denies additional aggravating or alleviating factors.  She rates her current pain a 9/10.  History obtained from patient and past medical records.  No interpreter is used.     HPI  History reviewed. No pertinent past medical history.  There are no active problems to display for this patient.   History reviewed. No pertinent surgical history.   OB History   No obstetric history on file.      Home Medications    Prior to Admission medications   Medication Sig Start Date End Date Taking? Authorizing Provider  ciprofloxacin (CIPRO) 500 MG tablet Take 1 tablet (500 mg total) by mouth every 12 (twelve) hours. 01/24/19   Argentina Kosch A, PA-C  dicyclomine (BENTYL) 20 MG tablet Take 1 tablet (20 mg total) by mouth 2 (two) times daily. 01/24/19   Gemma Ruan A, PA-C  doxycycline (VIBRAMYCIN) 100 MG capsule Take 1 capsule (100 mg total) by mouth 2 (two) times daily. 09/07/18   Cathren Laine, MD  fluticasone (FLONASE) 50 MCG/ACT nasal spray Place 1 spray into both nostrils daily. 06/16/16   Fawze, Mina A, PA-C    HYDROcodone-acetaminophen (NORCO/VICODIN) 5-325 MG tablet Take 2 tablets by mouth every 4 (four) hours as needed. 01/24/19   Azad Calame A, PA-C  methocarbamol (ROBAXIN) 500 MG tablet Take 1 tablet (500 mg total) by mouth every 8 (eight) hours as needed for muscle spasms. 04/27/18   Long, Arlyss Repress, MD  metroNIDAZOLE (FLAGYL) 500 MG tablet Take 1 tablet (500 mg total) by mouth 2 (two) times daily. 01/24/19   Miamor Ayler A, PA-C  potassium chloride SA (K-DUR) 20 MEQ tablet One tablet po bid x 3 days, then one tablet once a day 09/07/18   Cathren Laine, MD  traMADol (ULTRAM) 50 MG tablet Take 1 tablet (50 mg total) by mouth every 6 (six) hours as needed for moderate pain or severe pain. 09/07/18   Cathren Laine, MD    Family History No family history on file.  Social History Social History   Tobacco Use   Smoking status: Current Some Day Smoker    Packs/day: 0.50    Types: Cigarettes   Smokeless tobacco: Never Used  Substance Use Topics   Alcohol use: Not Currently   Drug use: No     Allergies   Ibuprofen   Review of Systems Review of Systems  Constitutional: Negative.   HENT: Negative.   Respiratory: Negative.   Cardiovascular: Negative.   Gastrointestinal: Positive for abdominal pain. Negative for abdominal distention, anal bleeding, blood in stool, constipation, diarrhea, nausea, rectal pain and vomiting.  Genitourinary: Negative.   Musculoskeletal: Negative.   Skin: Negative.   Neurological: Negative.   All other systems reviewed and are negative.  Physical Exam Updated Vital Signs BP 100/64 (BP Location: Left Arm)    Pulse 77    Temp 98.2 F (36.8 C) (Oral)    Resp 18    Ht  (1.626 m)    Wt 73.9 kg    LMP 01/05/2019    SpO2 100%    BMI 27.98 kg/m   Physical Exam Vitals signs and nursing note reviewed.  Constitutional:      General: She is not in acute distress.    Appearance: She is well-developed. She is not ill-appearing, toxic-appearing or  diaphoretic.  HENT:     Head: Normocephalic and atraumatic.     Mouth/Throat:     Mouth: Mucous membranes are moist.  Eyes:     Extraocular Movements: Extraocular movements intact.     Pupils: Pupils are equal, round, and reactive to light.  Neck:     Musculoskeletal: Normal range of motion.  Cardiovascular:     Rate and Rhythm: Normal rate.     Heart sounds: Normal heart sounds.  Pulmonary:     Effort: Pulmonary effort is normal. No respiratory distress.     Breath sounds: Normal breath sounds.  Abdominal:     General: Bowel sounds are normal. There is no distension.     Palpations: Abdomen is soft.     Tenderness: There is abdominal tenderness in the right lower quadrant, suprapubic area and left lower quadrant. There is no right CVA tenderness, left CVA tenderness, guarding or rebound. Negative signs include Murphy's sign and McBurney's sign.     Hernia: No hernia is present.     Comments: Diffuse tenderness palpation to bilateral left and right lower quadrants.  Genitourinary:    Comments: Normal appearing external female genitalia without rashes or lesions, normal vaginal epithelium. Normal appearing cervix without discharge or petechiae. Cervical os is closed. There is no bleeding noted at the os. No Odor. Bimanual: Minimal CMT,  nontender.  No palpable adnexal masses or tenderness. Uterus midline and not fixed. Rectovaginal exam was deferred.  No cystocele or rectocele noted. No pelvic lymphadenopathy noted. Wet prep was obtained.  Cultures for gonorrhea and chlamydia collected. Exam performed with chaperone in room. Musculoskeletal: Normal range of motion.     Comments: Moves all 4 extremities without difficulty.  Skin:    General: Skin is warm and dry.     Comments: Brisk capillary refill.  No rashes or lesions.  Neurological:     Mental Status: She is alert.     Comments: No facial droop.    ED Treatments / Results  Labs (all labs ordered are listed, but only abnormal  results are displayed) Labs Reviewed  WET PREP, GENITAL - Abnormal; Notable for the following components:      Result Value   WBC, Wet Prep HPF POC FEW (*)    All other components within normal limits  CBC WITH DIFFERENTIAL/PLATELET - Abnormal; Notable for the following components:   Neutro Abs 1.3 (*)    All other components within normal limits  COMPREHENSIVE METABOLIC PANEL - Abnormal; Notable for the following components:   Calcium 8.8 (*)    All other components within normal limits  LIPASE, BLOOD  PREGNANCY, URINE  GC/CHLAMYDIA PROBE AMP ([place]) NOT AT [facility]    EKG None  Radiology Ct Abdomen Pelvis W Contrast  Result Date: 01/24/2019 CLINICAL  DATA:  Abdominal pain. EXAM: CT ABDOMEN AND PELVIS WITH CONTRAST TECHNIQUE: Multidetector CT imaging of the abdomen and pelvis was performed using the standard protocol following bolus administration of intravenous contrast. CONTRAST:  100mL OMNIPAQUE IOHEXOL 300 MG/ML  SOLN COMPARISON:  CT dated September 07, 2018 FINDINGS: Lower chest: The lung bases are clear. The heart size is normal. Hepatobiliary: The liver is normal. Cholelithiasis without acute inflammation.There is no biliary ductal dilation. Pancreas: Normal contours without ductal dilatation. No peripancreatic fluid collection. Spleen: No splenic laceration or hematoma. Adrenals/Urinary Tract: --Adrenal glands: No adrenal hemorrhage. --Right kidney/ureter: No hydronephrosis or perinephric hematoma. --Left kidney/ureter: No hydronephrosis or perinephric hematoma. --Urinary bladder: Unremarkable. Stomach/Bowel: --Stomach/Duodenum: No hiatal hernia or other gastric abnormality. Normal duodenal course and caliber. --Small bowel: There are few mildly dilated fluid-filled loops of small bowel in the left lower quadrant without evidence for distinct transition point. --Colon: No focal abnormality. --Appendix: Normal. Vascular/Lymphatic: Normal course and caliber of the major abdominal vessels.  --No retroperitoneal lymphadenopathy. --No mesenteric lymphadenopathy. --No pelvic or inguinal lymphadenopathy. Reproductive: Unremarkable Other: No ascites or free air. The abdominal wall is normal. Musculoskeletal. No acute displaced fractures. IMPRESSION: 1. There are few mildly dilated fluid-filled loops of small bowel in the left lower quadrant without evidence for distinct transition point. These findings may be secondary to an enteritis. 2. Cholelithiasis without acute inflammation. Electronically Signed   By: Katherine Mantlehristopher  Green M.D.   On: 01/24/2019 22:50   [place] Pelvic Complete W Transvaginal And Torsion R/o  Result Date: 01/24/2019 CLINICAL DATA:  28 year old female with bilateral pelvic pain. EXAM: TRANSABDOMINAL AND TRANSVAGINAL ULTRASOUND OF PELVIS DOPPLER ULTRASOUND OF OVARIES TECHNIQUE: Both transabdominal and transvaginal ultrasound examinations of the pelvis were performed. Transabdominal technique was performed for global imaging of the pelvis including uterus, ovaries, adnexal regions, and pelvic cul-de-sac. It was necessary to proceed with endovaginal exam following the transabdominal exam to visualize the endometrium and the ovaries. Color and duplex Doppler ultrasound was utilized to evaluate blood flow to the ovaries. COMPARISON:  CT abdomen pelvis dated 09/07/2018. FINDINGS: Uterus Measurements: 8.8 x 4.0 x 5.1 cm = volume: 94 mL. The uterus is anteverted and slightly heterogeneous with findings of possible mild adenomyosis. Endometrium Thickness: 9 mm.  No focal abnormality visualized. Right ovary Measurements: 2.3 x 2.0 x 2.1 cm = volume: 4.9 mL. Normal appearance/no adnexal mass. Left ovary Measurements: 3.1 x 1.5 x 1.9 cm = volume: 4.7 mL. Normal appearance/no adnexal mass. Pulsed Doppler evaluation of both ovaries demonstrates normal low-resistance arterial and venous waveforms. Other findings No abnormal free fluid. IMPRESSION: 1. Slightly heterogeneous uterus with findings of possible  adenomyosis. 2. Unremarkable endometrium and ovaries. Electronically Signed   By: Elgie CollardArash  Radparvar M.D.   On: 01/24/2019 21:07    Procedures Procedures (including critical care time)  Medications Ordered in ED Medications  sodium chloride 0.9 % bolus 1,000 mL (1,000 mLs Intravenous New Bag/Given 01/24/19 2051)  ondansetron (ZOFRAN) injection 4 mg (4 mg Intravenous Given 01/24/19 2046)  morphine 4 MG/ML injection 4 mg (4 mg Intravenous Given 01/24/19 2046)  iohexol (OMNIPAQUE) 300 MG/ML solution 100 mL (100 mLs Intravenous Contrast Given 01/24/19 2231)  dicyclomine (BENTYL) capsule 10 mg (10 mg Oral Given 01/24/19 2228)  fentaNYL (SUBLIMAZE) injection 25 mcg (25 mcg Intravenous Given 01/24/19 2227)   Initial Impression / Assessment and Plan / ED Course  I have reviewed the triage vital signs and the nursing notes.  Pertinent labs & imaging results that were available during my care of  the patient were reviewed by me and considered in my medical decision making (see chart for details).  28 year old female appears otherwise well presents for evaluation of lower abdominal pain x24 hours.  Last bowel movement yesterday without melena or hematochezia.  No concerns for STDs, no vaginal discharge.  States pain comes in "waves."  No emesis.  Tolerating p.o. intake at home without difficulty.  Denies chance of pregnancy.  She is afebrile, nonseptic, non-ill-appearing.  Heart and lungs clear.  Abdomen with diffuse tenderness to lower abdomen.  Negative psoas, obturator sign.  Plan for labs, ultrasound, pain management and reevaluate.  Clinical Course as of Jan 23 2317  Wynelle Link Jan 24, 2019  2118 Possible adenomyosis. No torsion.  US PELVIC COMPLETE W TRANSVAGINAL AND TORSION R/O [BH]  2119 No electrolyte, renal or liver abnormalities  Comprehensive metabolic panel(!) [BH]  2119 No leukocytosis, Hgb stable  CBC with Differential(!) [BH]  2119 normal  Lipase, blood [BH]  2119 Pain improved however will plan  for pelvic exam.   [BH]  2316 Possible enteritis  CT Abdomen Pelvis W Contrast [BH]    Clinical Course User Index [BH] Tanesia Butner A, PA-C   Requesting additional pain medicine. Will order. She does have a soft blood pressure.  However patient does run low.  She is laying on her left side during her blood pressure check.  We will have nurse reassess.  Blood pressure improved.  CT scan with possible enteritis.  She does not have a white count however is symptomatic.  Will start antibiotics given she had this previously which resolved with the antibiotics.  Patient is nontoxic, nonseptic appearing, in no apparent distress.  Patient's pain and other symptoms adequately managed in emergency department.  Fluid bolus given.  Labs, imaging and vitals reviewed.  Patient does not meet the SIRS or Sepsis criteria.  On repeat exam patient does not have a surgical abdomin and there are no peritoneal signs.  No indication of appendicitis, bowel obstruction, bowel perforation, cholecystitis, diverticulitis, PID or ectopic pregnancy.  Patient discharged home with symptomatic treatment and given strict instructions for follow-up with their primary care physician.  I have also discussed reasons to return immediately to the ER.  Patient expresses understanding and agrees with plan.  The patient has been appropriately medically screened and/or stabilized in the ED. I have low suspicion for any other emergent medical condition which would require further screening, evaluation or treatment in the ED or require inpatient management.  Patient is hemodynamically stable and in no acute distress.  Patient able to ambulate in department prior to ED.  Evaluation does not show acute pathology that would require ongoing or additional emergent interventions while in the emergency department or further inpatient treatment.  I have discussed the diagnosis with the patient and answered all questions.  Pain is been managed while  in the emergency department and patient has no further complaints prior to discharge.  Patient is comfortable with plan discussed in room and is stable for discharge at this time.  I have discussed strict return precautions for returning to the emergency department.  Patient was encouraged to follow-up with PCP/specialist refer to at discharge.    Final Clinical Impressions(s) / ED Diagnoses   Final diagnoses:  Enteritis  Lower abdominal pain    ED Discharge Orders         Ordered    ciprofloxacin (CIPRO) 500 MG tablet  Every 12 hours     01/24/19 2310    metroNIDAZOLE (  FLAGYL) 500 MG tablet  2 times daily     01/24/19 2310    HYDROcodone-acetaminophen (NORCO/VICODIN) 5-325 MG tablet  Every 4 hours PRN     01/24/19 2310    dicyclomine (BENTYL) 20 MG tablet  2 times daily     01/24/19 2310           Argus Caraher A, PA-C 01/24/19 2318    Quintella Reichert, MD 01/24/19 2319

## 2019-01-24 NOTE — ED Notes (Signed)
Pt taken to radiology

## 2019-01-24 NOTE — ED Triage Notes (Signed)
Pt reports lower abd pain "like mini contractions" since last night. Last BM today was normal. Denies Vaginal discharge, vaginal bleeding, and urinary Sx. Denies n/v/d

## 2019-01-26 LAB — GC/CHLAMYDIA PROBE AMP (~~LOC~~) NOT AT ARMC
Chlamydia: NEGATIVE
Neisseria Gonorrhea: NEGATIVE

## 2019-01-31 ENCOUNTER — Encounter (HOSPITAL_BASED_OUTPATIENT_CLINIC_OR_DEPARTMENT_OTHER): Payer: Self-pay | Admitting: *Deleted

## 2019-01-31 ENCOUNTER — Emergency Department (HOSPITAL_BASED_OUTPATIENT_CLINIC_OR_DEPARTMENT_OTHER)
Admission: EM | Admit: 2019-01-31 | Discharge: 2019-01-31 | Disposition: A | Discharge status: Home / Self Care | Payer: Medicaid Other | Attending: Emergency Medicine | Admitting: Emergency Medicine

## 2019-01-31 ENCOUNTER — Emergency Department (HOSPITAL_BASED_OUTPATIENT_CLINIC_OR_DEPARTMENT_OTHER): Payer: Medicaid Other

## 2019-01-31 ENCOUNTER — Other Ambulatory Visit: Payer: Self-pay

## 2019-01-31 DIAGNOSIS — Z79899 Other long term (current) drug therapy: Secondary | ICD-10-CM | POA: Insufficient documentation

## 2019-01-31 DIAGNOSIS — F1210 Cannabis abuse, uncomplicated: Secondary | ICD-10-CM | POA: Diagnosis not present

## 2019-01-31 DIAGNOSIS — R1110 Vomiting, unspecified: Secondary | ICD-10-CM | POA: Insufficient documentation

## 2019-01-31 DIAGNOSIS — F12188 Cannabis abuse with other cannabis-induced disorder: Secondary | ICD-10-CM | POA: Diagnosis not present

## 2019-01-31 DIAGNOSIS — F17210 Nicotine dependence, cigarettes, uncomplicated: Secondary | ICD-10-CM | POA: Insufficient documentation

## 2019-01-31 DIAGNOSIS — R109 Unspecified abdominal pain: Secondary | ICD-10-CM | POA: Diagnosis present

## 2019-01-31 LAB — RAPID URINE DRUG SCREEN, HOSP PERFORMED
Amphetamines: NOT DETECTED
Barbiturates: NOT DETECTED
Benzodiazepines: NOT DETECTED
Cocaine: NOT DETECTED
Opiates: NOT DETECTED
Tetrahydrocannabinol: POSITIVE — AB

## 2019-01-31 LAB — URINALYSIS, ROUTINE W REFLEX MICROSCOPIC
Bilirubin Urine: NEGATIVE
Glucose, UA: NEGATIVE mg/dL
Hgb urine dipstick: NEGATIVE
Ketones, ur: NEGATIVE mg/dL
Leukocytes,Ua: NEGATIVE
Nitrite: NEGATIVE
Protein, ur: NEGATIVE mg/dL
Specific Gravity, Urine: >1.03 — ABNORMAL HIGH (ref 1.005–1.030)
pH: 6 (ref 5.0–8.0)

## 2019-01-31 LAB — PREGNANCY, URINE: Preg Test, Ur: NEGATIVE

## 2019-01-31 MED ORDER — ONDANSETRON 8 MG PO TBDP: ORAL_TABLET | ORAL | 0 refills | Status: DC

## 2019-01-31 MED ORDER — HALOPERIDOL LACTATE 5 MG/ML IJ SOLN
2.0000 mg | Freq: Once | INTRAMUSCULAR | Status: AC
  Administered 2019-01-31: 2 mg via INTRAMUSCULAR
  Filled 2019-01-31: qty 1

## 2019-01-31 NOTE — ED Notes (Signed)
Crackers given for continued oral challenge.  No emesis noted post gingerale.

## 2019-01-31 NOTE — ED Notes (Signed)
Ginger ale given per pt request for oral challenge.

## 2019-01-31 NOTE — ED Provider Notes (Signed)
[facility] EMERGENCY DEPARTMENT Provider Note   CSN: [id number] Arrival date & time: 01/31/19  0030     History Chief Complaint  Patient presents with  . abdominal and chest pain    Megan Walters is a 28 y.o. female.  The history is provided by the patient.  Emesis Severity:  Moderate Timing:  Rare Quality:  Stomach contents Progression:  Resolved (last episode on Friday.  ) Chronicity:  Recurrent Recent urination:  Normal Context: not post-tussive   Relieved by:  Nothing Worsened by:  Nothing Ineffective treatments:  None tried Associated symptoms: abdominal pain   Associated symptoms: no arthralgias, no cough, no diarrhea and no fever   Associated symptoms comment:  Hurts when she vomits  Risk factors: no prior abdominal surgery   Patient has been seen on several occasions most recently 01/24/19 for abdominal pain and emesis. She presents stating she cannot keep down the medication she was given during her last visit.  No emesis on Saturday.  No f/c/r.  No diarrhea normal BM on Saturday am.       History reviewed. No pertinent past medical history.  There are no problems to display for this patient.   History reviewed. No pertinent surgical history.   OB History   No obstetric history on file.     No family history on file.  Social History   Tobacco Use  . Smoking status: Current Some Day Smoker    Packs/day: 0.50    Types: Cigarettes  . Smokeless tobacco: Never Used  Substance Use Topics  . Alcohol use: Yes    Comment: occasional   . Drug use: Yes    Types: Marijuana    Home Medications Prior to Admission medications   Medication Sig Start Date End Date Taking? Authorizing Provider  ciprofloxacin (CIPRO) 500 MG tablet Take 1 tablet (500 mg total) by mouth every 12 (twelve) hours. 01/24/19   Henderly, Britni A, PA-C  dicyclomine (BENTYL) 20 MG tablet Take 1 tablet (20 mg total) by mouth 2 (two) times daily. 01/24/19   Henderly, Britni A,  PA-C  doxycycline (VIBRAMYCIN) 100 MG capsule Take 1 capsule (100 mg total) by mouth 2 (two) times daily. 09/07/18   Lajean Saver, MD  fluticasone (FLONASE) 50 MCG/ACT nasal spray Place 1 spray into both nostrils daily. 06/16/16   Fawze, Mina A, PA-C  HYDROcodone-acetaminophen (NORCO/VICODIN) 5-325 MG tablet Take 1 tablet by mouth every 4 (four) hours as needed. 01/24/19   Henderly, Britni A, PA-C  methocarbamol (ROBAXIN) 500 MG tablet Take 1 tablet (500 mg total) by mouth every 8 (eight) hours as needed for muscle spasms. 04/27/18   Long, Wonda Olds, MD  metroNIDAZOLE (FLAGYL) 500 MG tablet Take 1 tablet (500 mg total) by mouth 2 (two) times daily. 01/24/19   Henderly, Britni A, PA-C  ondansetron (ZOFRAN ODT) 8 MG disintegrating tablet 8mg  ODT q8 hours prn nausea 01/31/19   Isami Mehra, MD  potassium chloride SA (K-DUR) 20 MEQ tablet One tablet po bid x 3 days, then one tablet once a day 09/07/18   Lajean Saver, MD  traMADol (ULTRAM) 50 MG tablet Take 1 tablet (50 mg total) by mouth every 6 (six) hours as needed for moderate pain or severe pain. 09/07/18   Lajean Saver, MD    Allergies    Ibuprofen  Review of Systems   Review of Systems  Constitutional: Negative for fever.  HENT: Negative for congestion.   Eyes: Negative for visual disturbance.  Respiratory: Negative  for cough and shortness of breath.   Cardiovascular: Negative for chest pain, palpitations and leg swelling.  Gastrointestinal: Positive for abdominal pain and vomiting. Negative for constipation and diarrhea.  Genitourinary: Negative for dysuria.  Musculoskeletal: Negative for arthralgias.  Psychiatric/Behavioral: Negative for agitation.  All other systems reviewed and are negative.   Physical Exam Updated Vital Signs BP 92/63 (BP Location: Right Arm)   Pulse (!) 25   Temp 98.4 F (36.9 C) (Oral)   Resp 11   Ht  (1.626 m)   Wt 73.5 kg   LMP 01/05/2019 Comment: Neg preg test 01/31/2019  SpO2 96%   BMI 27.81 kg/m    Physical Exam Vitals and nursing note reviewed.  Constitutional:      Comments: curled up in a fetal position on side in room, preferring to sleep even during history pulls blankets over head and goes back to sleep  HENT:     Head: Normocephalic and atraumatic.     Nose: Nose normal.  Eyes:     Conjunctiva/sclera: Conjunctivae normal.     Pupils: Pupils are equal, round, and reactive to light.  Cardiovascular:     Rate and Rhythm: Normal rate and regular rhythm.     Pulses: Normal pulses.     Heart sounds: Normal heart sounds.  Pulmonary:     Effort: Pulmonary effort is normal.     Breath sounds: Normal breath sounds.  Abdominal:     General: Abdomen is flat. Bowel sounds are normal.     Tenderness: There is no abdominal tenderness. There is no guarding or rebound. Negative signs include Murphy's sign and McBurney's sign.     Hernia: No hernia is present.  Musculoskeletal:        General: Normal range of motion.     Cervical back: Normal range of motion and neck supple.  Skin:    General: Skin is warm and dry.     Capillary Refill: Capillary refill takes less than 2 seconds.  Neurological:     General: No focal deficit present.     Mental Status: She is oriented to person, place, and time.     Deep Tendon Reflexes: Reflexes normal.  Psychiatric:        Thought Content: Thought content normal.     ED Results / Procedures / Treatments   Labs (all labs ordered are listed, but only abnormal results are displayed) Results for orders placed or performed during the hospital encounter of 01/31/19  Pregnancy, urine  Result Value Ref Range   Preg Test, Ur NEGATIVE NEGATIVE  Urinalysis, Routine w reflex microscopic  Result Value Ref Range   Color, Urine YELLOW YELLOW   APPearance CLEAR CLEAR   Specific Gravity, Urine >1.030 (H) 1.005 - 1.030   pH 6.0 5.0 - 8.0   Glucose, UA NEGATIVE NEGATIVE mg/dL   Hgb urine dipstick NEGATIVE NEGATIVE   Bilirubin Urine NEGATIVE NEGATIVE     Ketones, ur NEGATIVE NEGATIVE mg/dL   Protein, ur NEGATIVE NEGATIVE mg/dL   Nitrite NEGATIVE NEGATIVE   Leukocytes,Ua NEGATIVE NEGATIVE  Rapid urine drug screen (hospital performed)  Result Value Ref Range   Opiates NONE DETECTED NONE DETECTED   Cocaine NONE DETECTED NONE DETECTED   Benzodiazepines NONE DETECTED NONE DETECTED   Amphetamines NONE DETECTED NONE DETECTED   Tetrahydrocannabinol POSITIVE (A) NONE DETECTED   Barbiturates NONE DETECTED NONE DETECTED   CT Abdomen Pelvis W Contrast  Result Date: 01/24/2019 CLINICAL DATA:  Abdominal pain. EXAM: CT ABDOMEN AND  PELVIS WITH CONTRAST TECHNIQUE: Multidetector CT imaging of the abdomen and pelvis was performed using the standard protocol following bolus administration of intravenous contrast. CONTRAST:  100mL OMNIPAQUE IOHEXOL 300 MG/ML  SOLN COMPARISON:  CT dated September 07, 2018 FINDINGS: Lower chest: The lung bases are clear. The heart size is normal. Hepatobiliary: The liver is normal. Cholelithiasis without acute inflammation.There is no biliary ductal dilation. Pancreas: Normal contours without ductal dilatation. No peripancreatic fluid collection. Spleen: No splenic laceration or hematoma. Adrenals/Urinary Tract: --Adrenal glands: No adrenal hemorrhage. --Right kidney/ureter: No hydronephrosis or perinephric hematoma. --Left kidney/ureter: No hydronephrosis or perinephric hematoma. --Urinary bladder: Unremarkable. Stomach/Bowel: --Stomach/Duodenum: No hiatal hernia or other gastric abnormality. Normal duodenal course and caliber. --Small bowel: There are few mildly dilated fluid-filled loops of small bowel in the left lower quadrant without evidence for distinct transition point. --Colon: No focal abnormality. --Appendix: Normal. Vascular/Lymphatic: Normal course and caliber of the major abdominal vessels. --No retroperitoneal lymphadenopathy. --No mesenteric lymphadenopathy. --No pelvic or inguinal lymphadenopathy. Reproductive: Unremarkable  Other: No ascites or free air. The abdominal wall is normal. Musculoskeletal. No acute displaced fractures. IMPRESSION: 1. There are few mildly dilated fluid-filled loops of small bowel in the left lower quadrant without evidence for distinct transition point. These findings may be secondary to an enteritis. 2. Cholelithiasis without acute inflammation. Electronically Signed   By: Katherine Mantlehristopher  Green M.D.   On: 01/24/2019 22:50   DG Abdomen Acute W/Chest  Result Date: 01/31/2019 CLINICAL DATA:  Vomiting. EXAM: DG ABDOMEN ACUTE W/ 1V CHEST COMPARISON:  January 24, 2019 FINDINGS: There is no evidence of dilated bowel loops or free intraperitoneal air. No radiopaque calculi or other significant radiographic abnormality is seen. Heart size and mediastinal contours are within normal limits. Both lungs are clear. IMPRESSION: Negative abdominal radiographs.  No acute cardiopulmonary disease. Electronically Signed   By: Katherine Mantlehristopher  Green M.D.   On: 01/31/2019 02:55   US PELVIC COMPLETE W TRANSVAGINAL AND TORSION R/O  Result Date: 01/24/2019 CLINICAL DATA:  28 year old female with bilateral pelvic pain. EXAM: TRANSABDOMINAL AND TRANSVAGINAL ULTRASOUND OF PELVIS DOPPLER ULTRASOUND OF OVARIES TECHNIQUE: Both transabdominal and transvaginal ultrasound examinations of the pelvis were performed. Transabdominal technique was performed for global imaging of the pelvis including uterus, ovaries, adnexal regions, and pelvic cul-de-sac. It was necessary to proceed with endovaginal exam following the transabdominal exam to visualize the endometrium and the ovaries. Color and duplex Doppler ultrasound was utilized to evaluate blood flow to the ovaries. COMPARISON:  CT abdomen pelvis dated 09/07/2018. FINDINGS: Uterus Measurements: 8.8 x 4.0 x 5.1 cm = volume: 94 mL. The uterus is anteverted and slightly heterogeneous with findings of possible mild adenomyosis. Endometrium Thickness: 9 mm.  No focal abnormality visualized. Right  ovary Measurements: 2.3 x 2.0 x 2.1 cm = volume: 4.9 mL. Normal appearance/no adnexal mass. Left ovary Measurements: 3.1 x 1.5 x 1.9 cm = volume: 4.7 mL. Normal appearance/no adnexal mass. Pulsed Doppler evaluation of both ovaries demonstrates normal low-resistance arterial and venous waveforms. Other findings No abnormal free fluid. IMPRESSION: 1. Slightly heterogeneous uterus with findings of possible adenomyosis. 2. Unremarkable endometrium and ovaries. Electronically Signed   By: Elgie CollardArash  Radparvar M.D.   On: 01/24/2019 21:07    EKG EKG Interpretation  Date/Time:  Sunday January 31 2019 00:44:43 EST Ventricular Rate:  66 PR Interval:    QRS Duration: 87 QT Interval:  427 QTC Calculation: 448 R Axis:   60 Text Interpretation: Sinus rhythm Confirmed by Nicanor AlconPalumbo, Shahram Alexopoulos ([id number]) on 01/31/2019 1:49:51 AM  Radiology DG Abdomen Acute W/Chest  Result Date: 01/31/2019 CLINICAL DATA:  Vomiting. EXAM: DG ABDOMEN ACUTE W/ 1V CHEST COMPARISON:  January 24, 2019 FINDINGS: There is no evidence of dilated bowel loops or free intraperitoneal air. No radiopaque calculi or other significant radiographic abnormality is seen. Heart size and mediastinal contours are within normal limits. Both lungs are clear. IMPRESSION: Negative abdominal radiographs.  No acute cardiopulmonary disease. Electronically Signed   By: Katherine Mantle M.D.   On: 01/31/2019 02:55    Procedures Procedures (including critical care time)  Medications Ordered in ED Medications  haloperidol lactate (HALDOL) injection 2 mg (2 mg Intramuscular Given 01/31/19 0256)    ED Course  I have reviewed the triage vital signs and the nursing notes.  Pertinent labs & imaging results that were available during my care of the patient were reviewed by me and considered in my medical decision making (see chart for details).    No vomiting without intervention.  Given the increasing number of visits without objective findings I suspected  cannabis induced hyperemesis syndrome.  UDS is positive for THC.  Previous labs and CT and today's acute abdominal series do not show obstruction.  Patient was given haldol in the ED.  She was able to PO challenge successfully.  I do not think this patient needs reimaging at this time.  Stop using marijuana.    Simrin Vegh was evaluated in Emergency Department on 01/31/2019 for the symptoms described in the history of present illness. She was evaluated in the context of the global COVID-19 pandemic, which necessitated consideration that the patient might be at risk for infection with the SARS-CoV-2 virus that causes COVID-19. Institutional protocols and algorithms that pertain to the evaluation of patients at risk for COVID-19 are in a state of rapid change based on information released by regulatory bodies including the CDC and federal and state organizations. These policies and algorithms were followed during the patient's care in the ED.  Final Clinical Impression(s) / ED Diagnoses   Return for intractable cough, coughing up blood,fevers >100.4 unrelieved by medication, shortness of breath, intractable vomiting, chest pain, shortness of breath, weakness,numbness, changes in speech, facial asymmetry,abdominal pain, passing out,Inability to tolerate liquids or food, cough, altered mental status or any concerns. No signs of systemic illness or infection. The patient is nontoxic-appearing on exam and vital signs are within normal limits.   I have reviewed the triage vital signs and the nursing notes. Pertinent labs &imaging results that were available during my care of the patient were reviewed by me and considered in my medical decision making (see chart for details).  After history, exam, and medical workup I feel the patient has been appropriately medically screened and is safe for discharge home. Pertinent diagnoses were discussed with the patient. Patient was given return    Eugina Row,  Kemora Pinard, MD 01/31/19 [phone]

## 2019-01-31 NOTE — ED Notes (Signed)
No emesis noted post po challenge, stable for d/c per Palumbo MD.

## 2019-01-31 NOTE — ED Triage Notes (Addendum)
Pt states she was recently seen for abd pain. Has not been able to tolerate the antibiotics due to vomiting. Still with "same abdominal pain" as on 12-6. States today she developed chest pain. States pain is sharp and comes and goes. States pain worse with movement. C/o aching all over. Denies any fevers or covid exposure. Has not taken any medications for pain. Also c/o of her breathing "being heavy"  resp even and unlabored.

## 2019-08-23 IMAGING — CT CT ABDOMEN AND PELVIS WITH CONTRAST
2 of 4 series · 16 of 46 positions shown, 18 images · IV contrast (APPLIED)
Comparison: None.

CLINICAL DATA: 28-year-old female with generalized abdominal pain.

EXAM:
CT ABDOMEN AND PELVIS WITH CONTRAST
TECHNIQUE: Multidetector CT imaging of the abdomen and pelvis was performed
using the standard protocol following bolus administration of
intravenous contrast.
CONTRAST:  100mL OMNIPAQUE IOHEXOL 300 MG/ML  SOLN

[Series 2: axial st · axial · 0.61mm/px · z∈[-487,-92]mm · 13 of 87 slices shown, 15 images]
[im 4/87  soft-tissue]
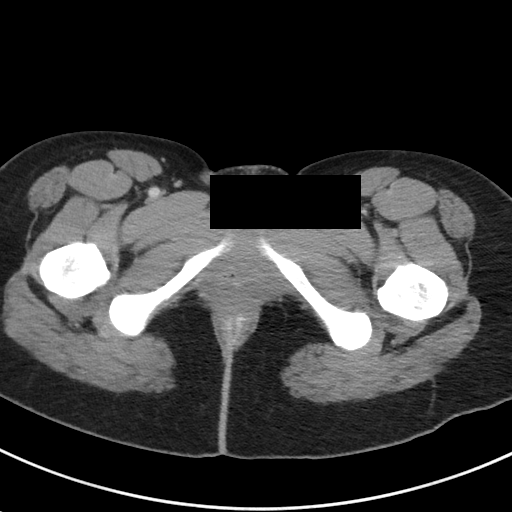
[im 4/87  bone]
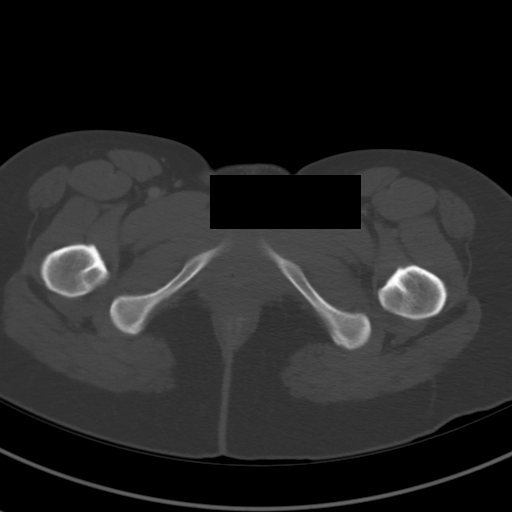
[im 11/87  soft-tissue]
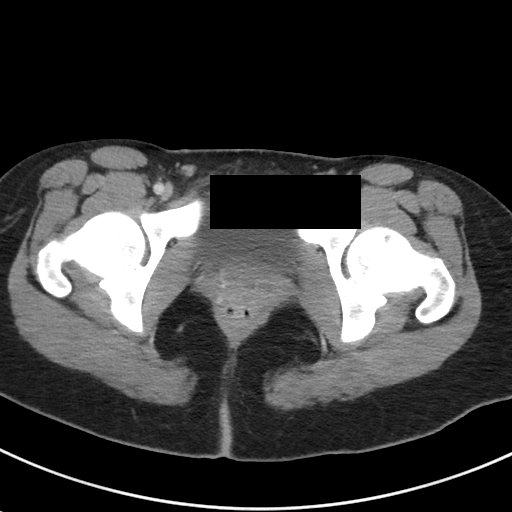
[im 18/87  soft-tissue]
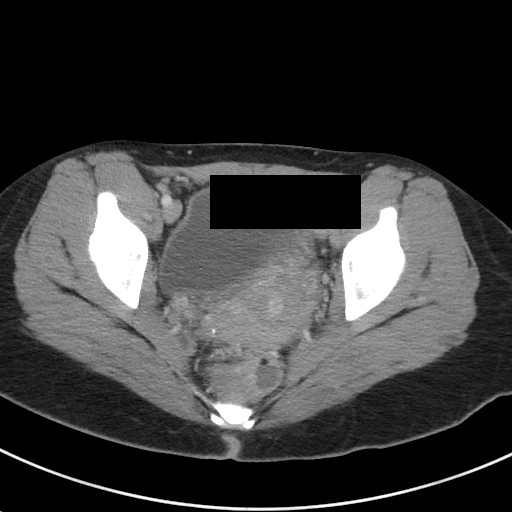
[im 25/87  soft-tissue]
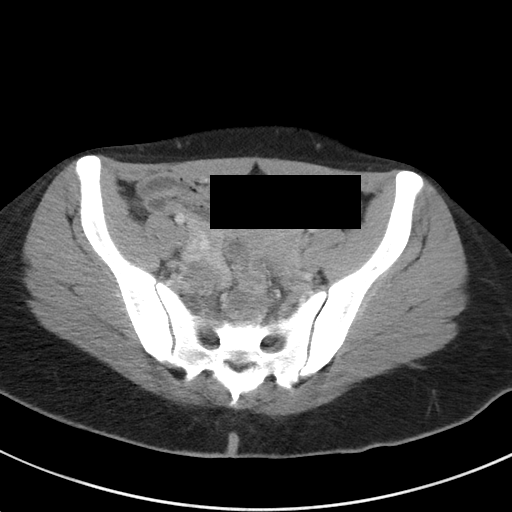
[im 31/87  soft-tissue]
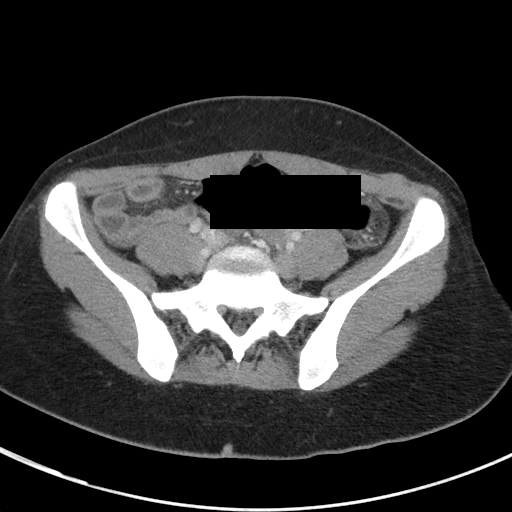
[im 38/87  soft-tissue]
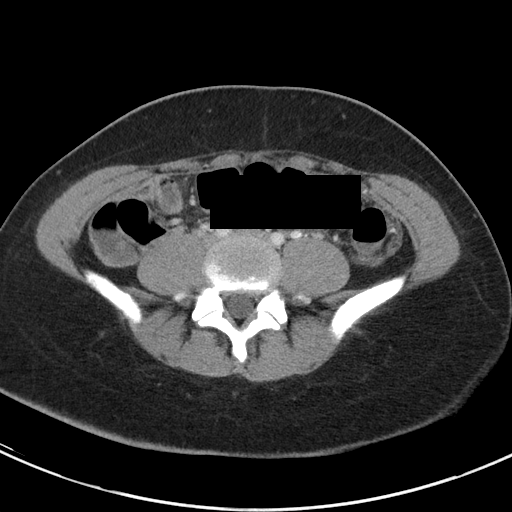
[im 45/87  soft-tissue]
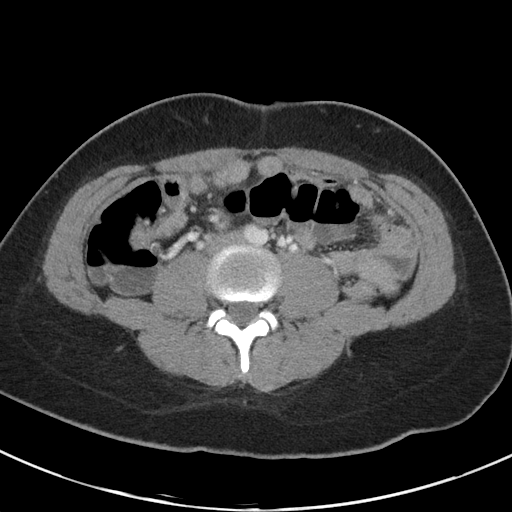
[im 49/87  soft-tissue]
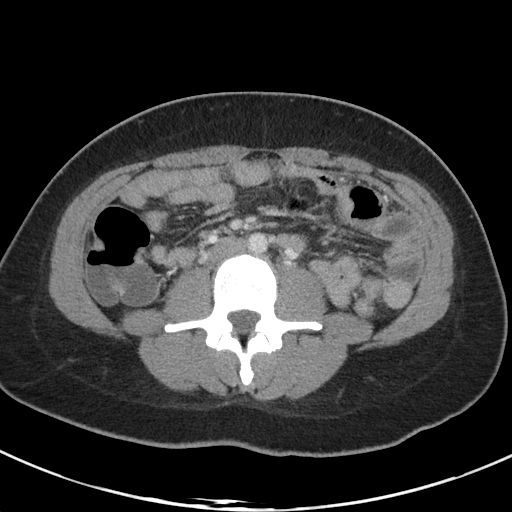
[im 56/87  soft-tissue]
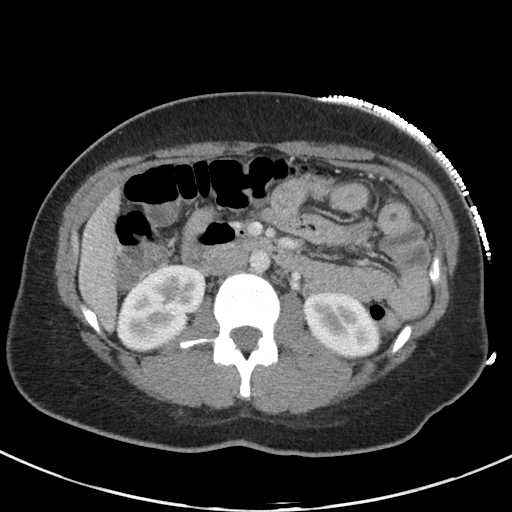
[im 56/87  bone]
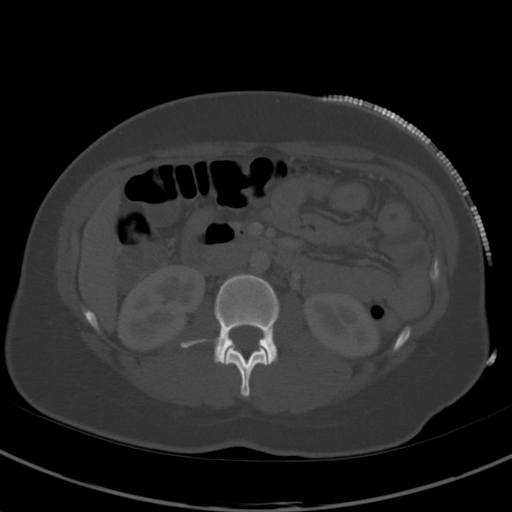
[im 62/87  soft-tissue]
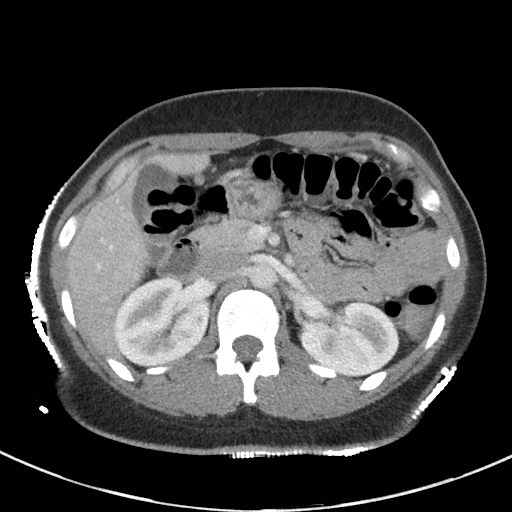
[im 69/87  soft-tissue]
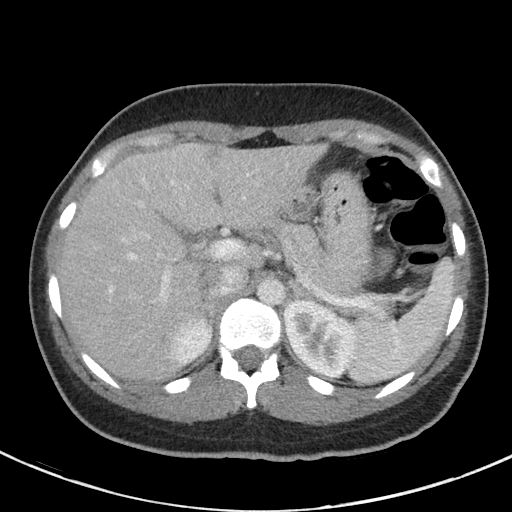
[im 76/87  soft-tissue]
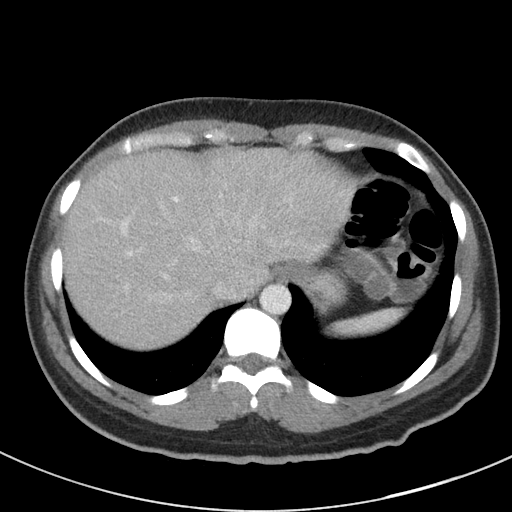
[im 83/87  soft-tissue]
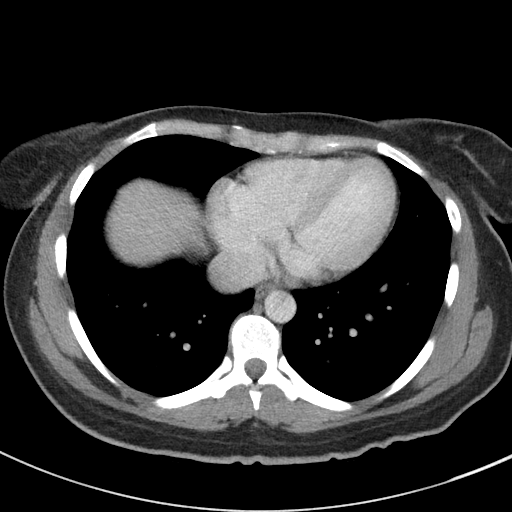

[Series 5: coronal st · coronal · 0.65mm/px · 3 of 84 slices shown]
[im 28/84  soft-tissue]
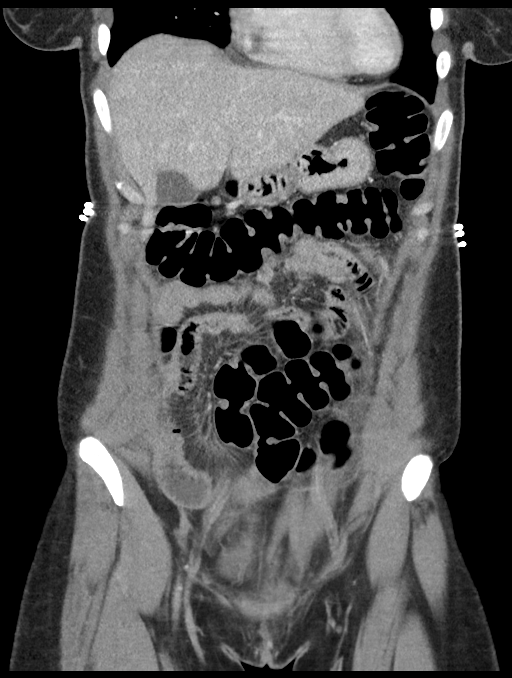
[im 37/84  soft-tissue]
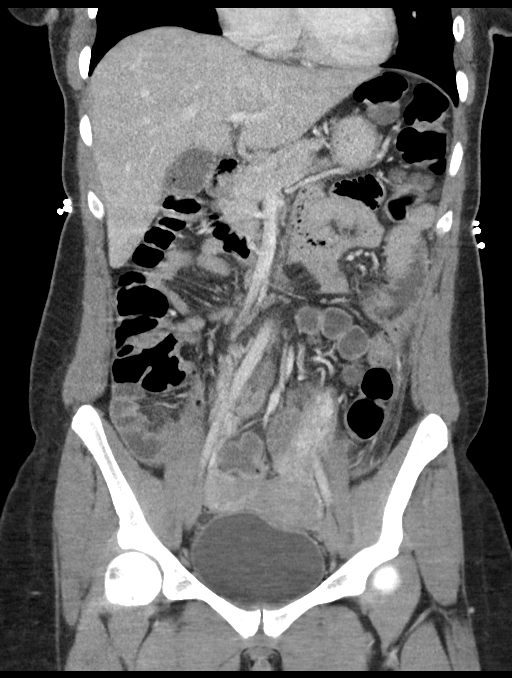
[im 47/84  soft-tissue]
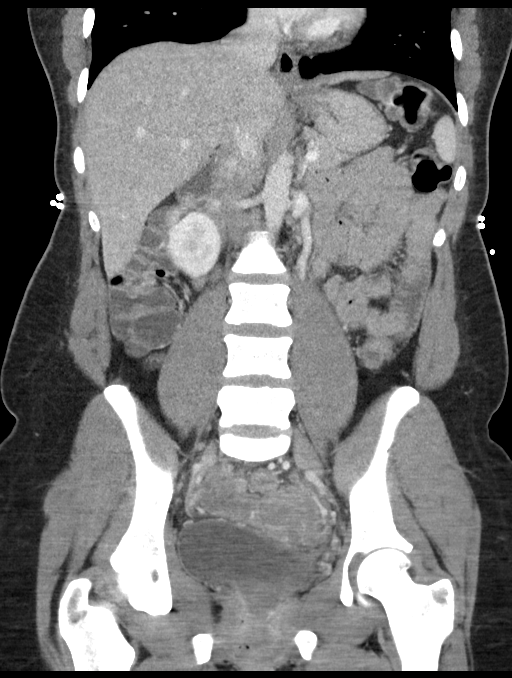

[16 of 46 positions shown; findings below may reference images not displayed]

FINDINGS: Lower chest: The visualized lung bases are clear.

No intra-abdominal free air or free fluid.

Hepatobiliary: Apparent diffuse fatty infiltration of the liver. No
intrahepatic biliary ductal dilatation. Noncalcified stones
containing gas noted in the gallbladder. No pericholecystic fluid.
Ultrasound may provide better evaluation of the gallbladder if
clinically indicated.

Pancreas: Unremarkable. No pancreatic ductal dilatation or
surrounding inflammatory changes.

Spleen: Normal in size without focal abnormality.

Adrenals/Urinary Tract: Adrenal glands are unremarkable. Kidneys are
normal, without renal calculi, focal lesion, or hydronephrosis.
Bladder is unremarkable.

Stomach/Bowel: There is diffuse thickened appearance of the small
bowel loops with edema in the small bowel mesentery most consistent
with enteritis. Clinical correlation is recommended. There is no
bowel obstruction. Loose stool noted throughout the colon compatible
with diarrheal state. The appendix is normal.

Vascular/Lymphatic: The abdominal aorta and IVC are unremarkable. No
portal venous gas. There is no adenopathy.

Reproductive: The uterus is anteverted and grossly unremarkable. No
adnexal masses identified.

Other: None

Musculoskeletal: No acute or significant osseous findings.
IMPRESSION: 1. Enteritis with diarrheal state. Correlation with clinical exam
and stool cultures recommended. No bowel obstruction. Normal
appendix.
2. Fatty liver.
3. Cholelithiasis. Ultrasound may provide better evaluation of the
gallbladder if clinically indicated.

## 2019-11-29 ENCOUNTER — Encounter (HOSPITAL_BASED_OUTPATIENT_CLINIC_OR_DEPARTMENT_OTHER): Payer: Self-pay | Admitting: Emergency Medicine

## 2019-11-29 ENCOUNTER — Other Ambulatory Visit: Payer: Self-pay

## 2019-11-29 ENCOUNTER — Emergency Department (HOSPITAL_BASED_OUTPATIENT_CLINIC_OR_DEPARTMENT_OTHER)
Admission: EM | Admit: 2019-11-29 | Discharge: 2019-11-29 | Disposition: A | Discharge status: Home / Self Care | Payer: Medicaid Other | Attending: Emergency Medicine | Admitting: Emergency Medicine

## 2019-11-29 DIAGNOSIS — M542 Cervicalgia: Secondary | ICD-10-CM | POA: Insufficient documentation

## 2019-11-29 DIAGNOSIS — M549 Dorsalgia, unspecified: Secondary | ICD-10-CM | POA: Diagnosis not present

## 2019-11-29 DIAGNOSIS — F17210 Nicotine dependence, cigarettes, uncomplicated: Secondary | ICD-10-CM | POA: Diagnosis not present

## 2019-11-29 DIAGNOSIS — M7918 Myalgia, other site: Secondary | ICD-10-CM

## 2019-11-29 MED ORDER — LIDOCAINE 5 % EX PTCH: 1.0000 | MEDICATED_PATCH | CUTANEOUS | 0 refills | Status: AC

## 2019-11-29 MED ORDER — ORPHENADRINE CITRATE ER 100 MG PO TB12: 100.0000 mg | ORAL_TABLET | Freq: Two times a day (BID) | ORAL | 0 refills | Status: AC

## 2019-11-29 MED ORDER — HYDROCODONE-ACETAMINOPHEN 5-325 MG PO TABS: 1.0000 | ORAL_TABLET | ORAL | 0 refills | Status: AC | PRN

## 2019-11-29 MED ORDER — HYDROXYZINE HCL 25 MG PO TABS
25.0000 mg | ORAL_TABLET | Freq: Once | ORAL | Status: AC
  Administered 2019-11-29: 25 mg via ORAL
  Filled 2019-11-29: qty 1

## 2019-11-29 MED ORDER — LIDOCAINE 5 % EX PTCH
2.0000 | MEDICATED_PATCH | CUTANEOUS | Status: DC
  Administered 2019-11-29: 2 via TRANSDERMAL
  Filled 2019-11-29 (×2): qty 2
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
## 2019-11-29 NOTE — Discharge Instructions (Addendum)
Lidoderm patch to sore areas as prescribed. Norflex as needed as directed for muscle spasm. Norco as needed as prescribed for pain not controlled with above treatment and Tylenol. Norco contains Tylenol, take one or the other. Warm compresses to sore muscles followed by gentle stretching. Follow up with your PCP for recheck and referral to physical therapy if not improving.
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
## 2020-01-09 IMAGING — US US PELVIS COMPLETE TRANSABD/TRANSVAG W DUPLEX
1 series · 13 of 25 positions shown · non-contrast
Comparison: CT abdomen pelvis dated 09/07/2018.

CLINICAL DATA: 28-year-old female with bilateral pelvic pain.

EXAM:
TRANSABDOMINAL AND TRANSVAGINAL ULTRASOUND OF PELVIS
DOPPLER ULTRASOUND OF OVARIES
TECHNIQUE: Both transabdominal and transvaginal ultrasound examinations of the
pelvis were performed. Transabdominal technique was performed for
global imaging of the pelvis including uterus, ovaries, adnexal
regions, and pelvic cul-de-sac.
It was necessary to proceed with endovaginal exam following the
transabdominal exam to visualize the endometrium and the ovaries.
Color and duplex Doppler ultrasound was utilized to evaluate blood
flow to the ovaries.

[Series 1: us pelvis complete transabd/transvag w duplex · 13 of 51 slices shown]
[im 1/51]
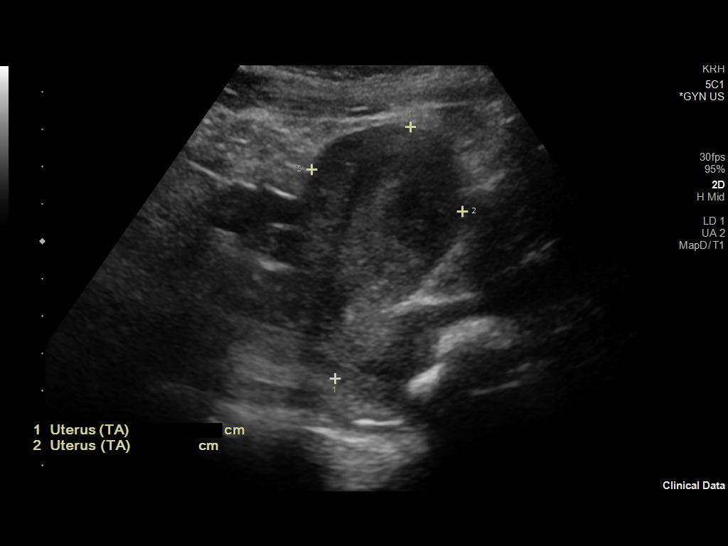
[im 5/51]
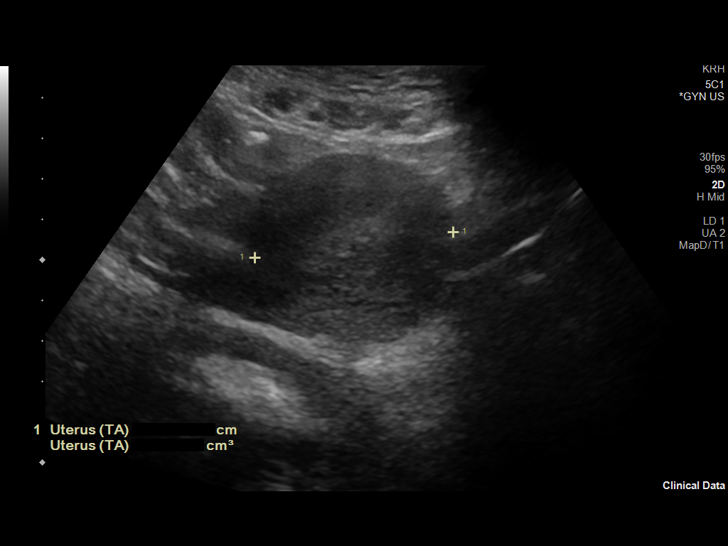
[im 9/51]
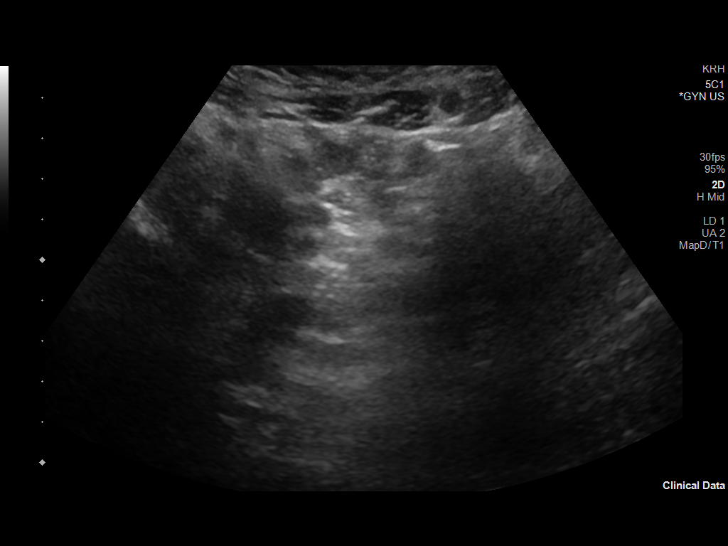
[im 13/51]
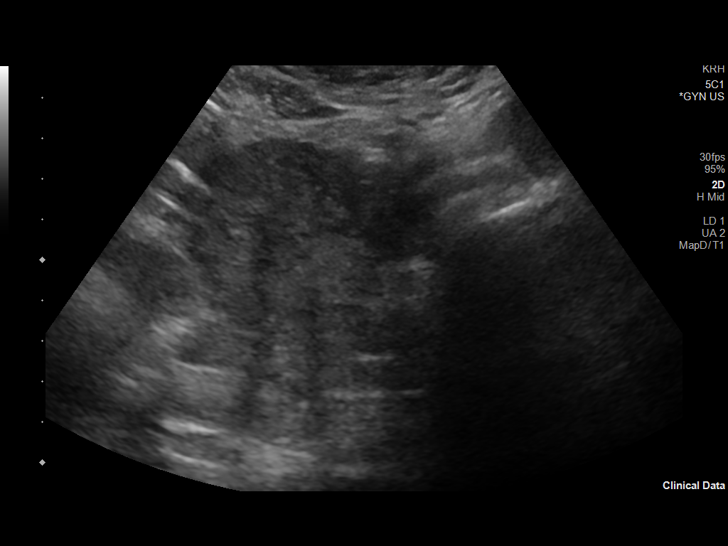
[im 17/51]
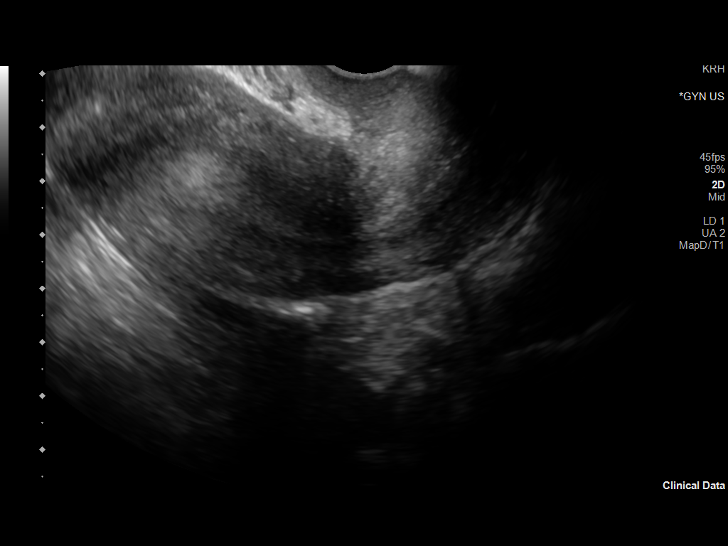
[im 21/51]
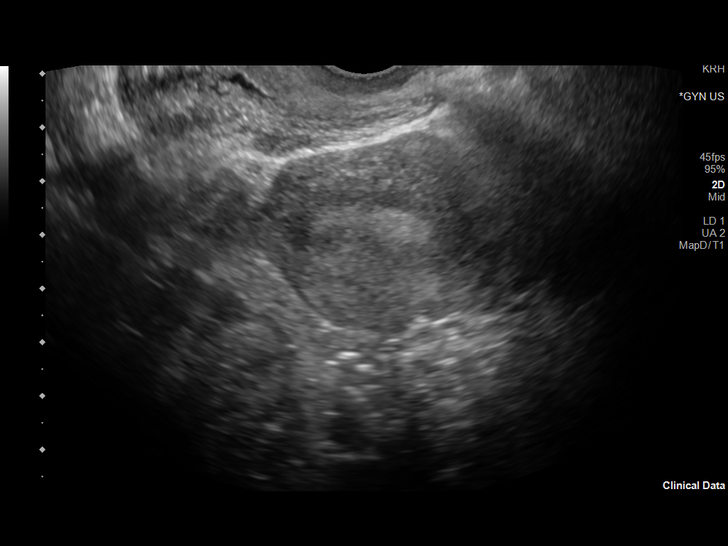
[im 26/51]
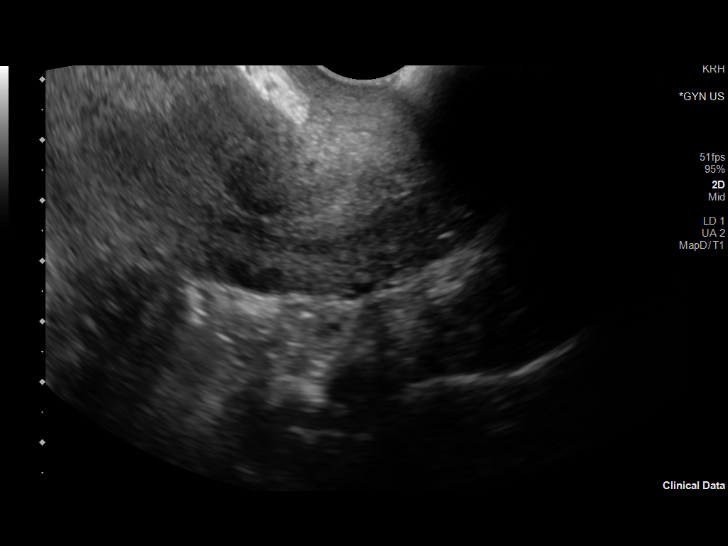
[im 30/51]
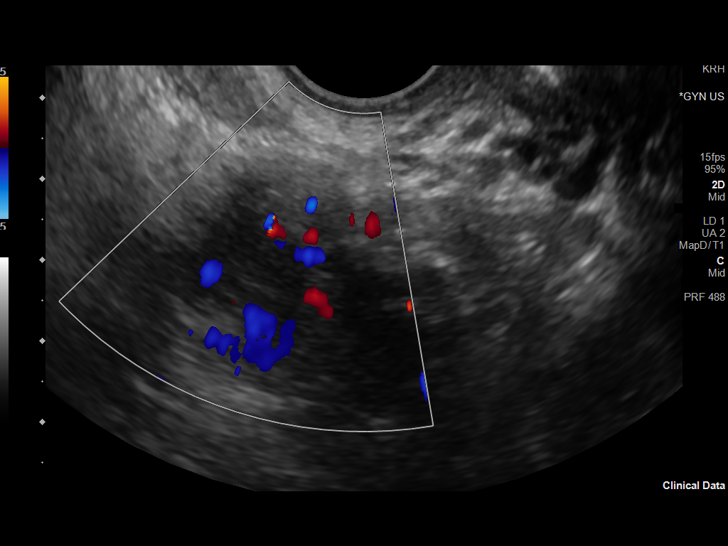
[im 34/51]
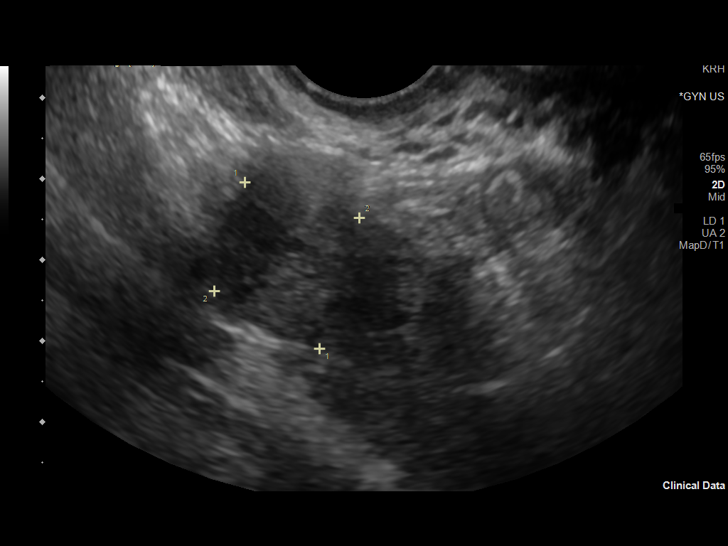
[im 38/51]
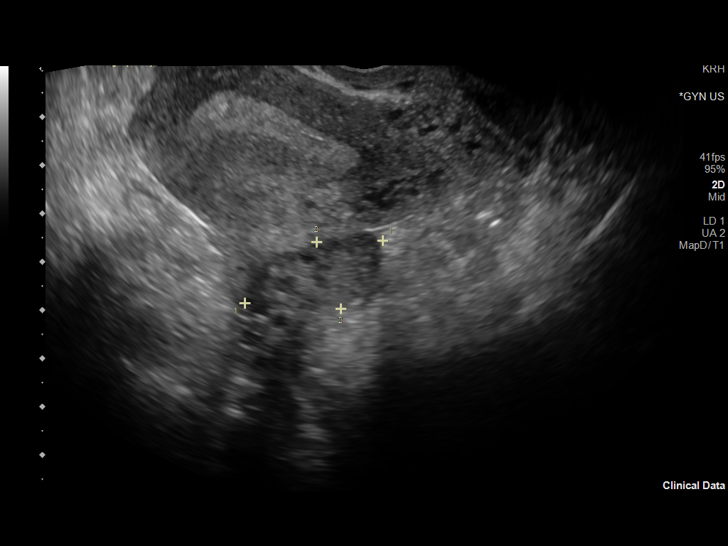
[im 42/51]
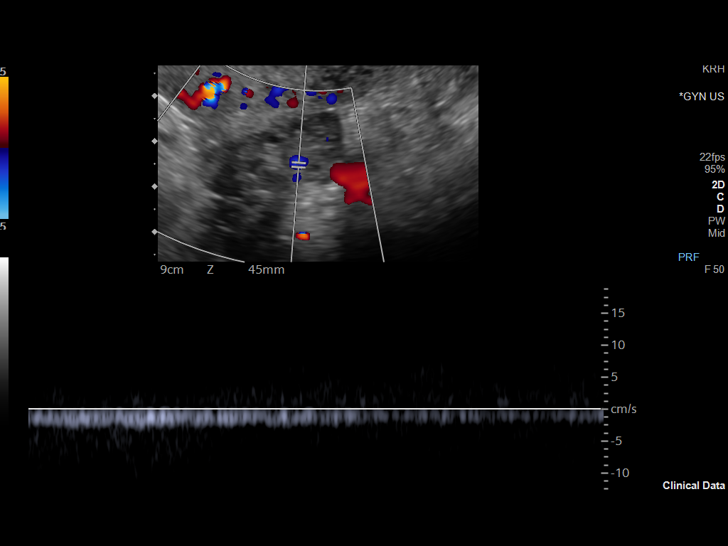
[im 46/51]
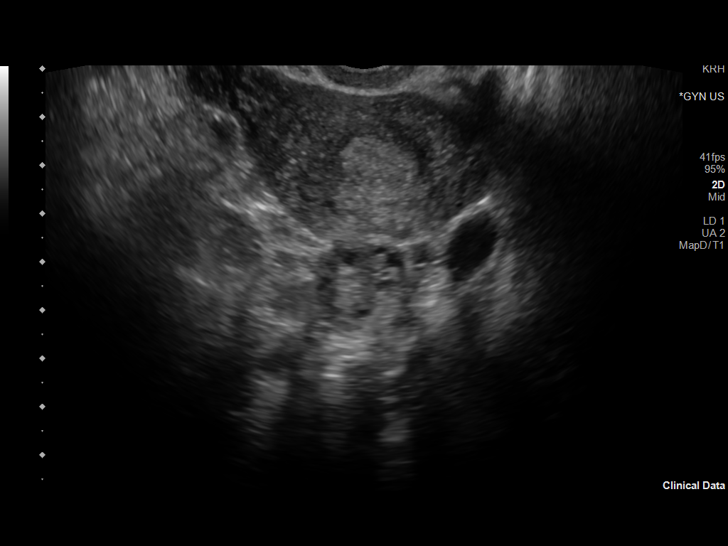
[im 51/51]
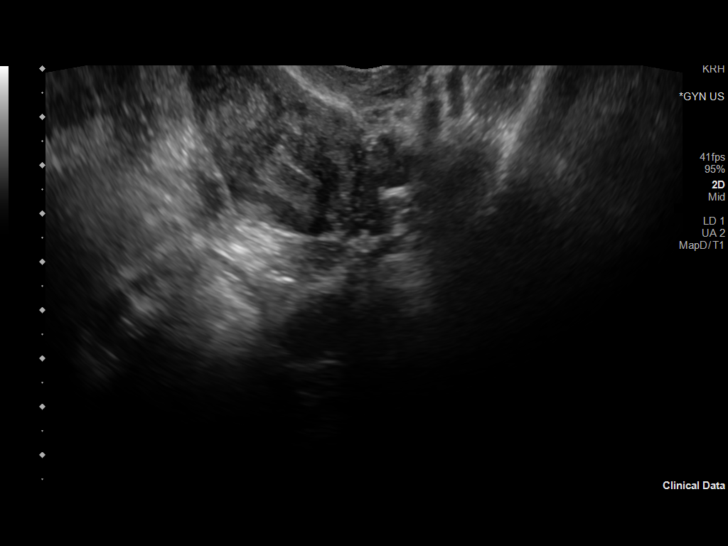

[13 of 25 positions shown; findings below may reference images not displayed]

FINDINGS: Uterus

Measurements: 8.8 x 4.0 x 5.1 cm = volume: 94 mL. The uterus is
anteverted and slightly heterogeneous with findings of possible mild
adenomyosis.

Endometrium

Thickness: 9 mm.  No focal abnormality visualized.

Right ovary

Measurements: 2.3 x 2.0 x 2.1 cm = volume: 4.9 mL. Normal
appearance/no adnexal mass.

Left ovary

Measurements: 3.1 x 1.5 x 1.9 cm = volume: 4.7 mL. Normal
appearance/no adnexal mass.

Pulsed Doppler evaluation of both ovaries demonstrates normal
low-resistance arterial and venous waveforms.

Other findings

No abnormal free fluid.
IMPRESSION: 1. Slightly heterogeneous uterus with findings of possible
adenomyosis.
2. Unremarkable endometrium and ovaries.

## 2020-01-09 IMAGING — CT CT ABD-PELV W/ CM
2 of 4 series · 16 of 46 positions shown, 18 images · IV contrast (omnipaque)
Comparison: CT dated September 07, 2018

CLINICAL DATA: Abdominal pain.

EXAM:
CT ABDOMEN AND PELVIS WITH CONTRAST
TECHNIQUE: Multidetector CT imaging of the abdomen and pelvis was performed
using the standard protocol following bolus administration of
intravenous contrast.
CONTRAST:  100mL OMNIPAQUE IOHEXOL 300 MG/ML  SOLN

[Series 2: axial st · axial · 0.76mm/px · z∈[-64,+340]mm · 13 of 89 slices shown, 15 images]
[im 4/89  soft-tissue]
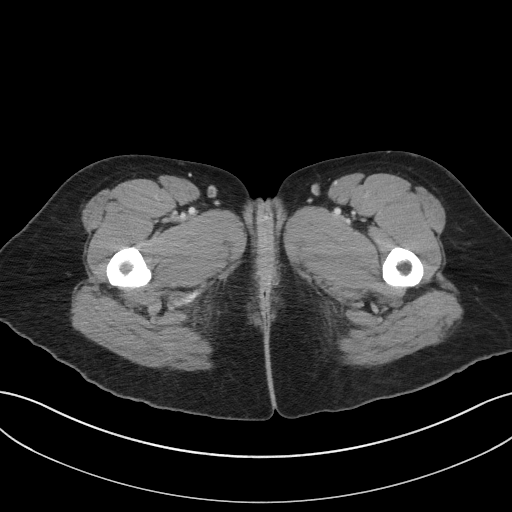
[im 4/89  bone]
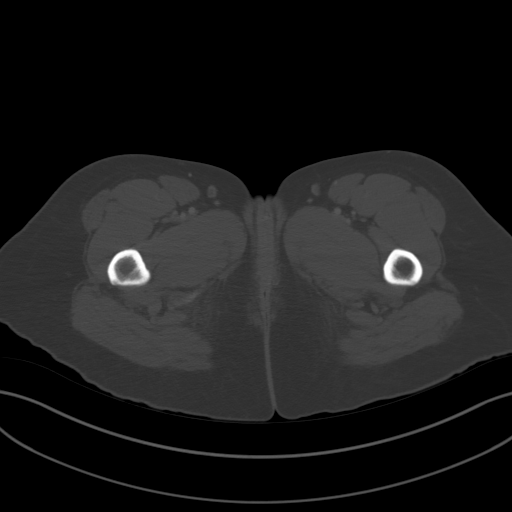
[im 11/89  soft-tissue]
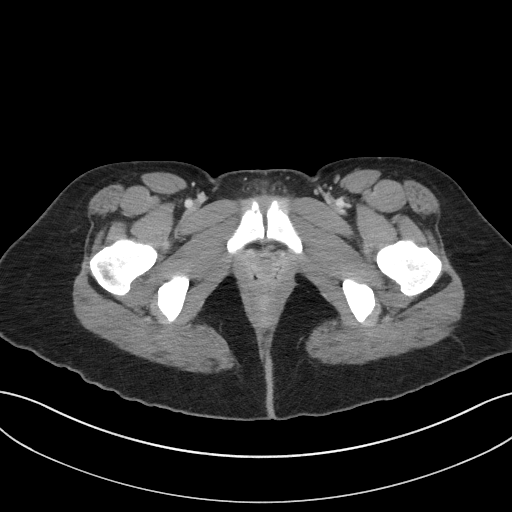
[im 18/89  soft-tissue]
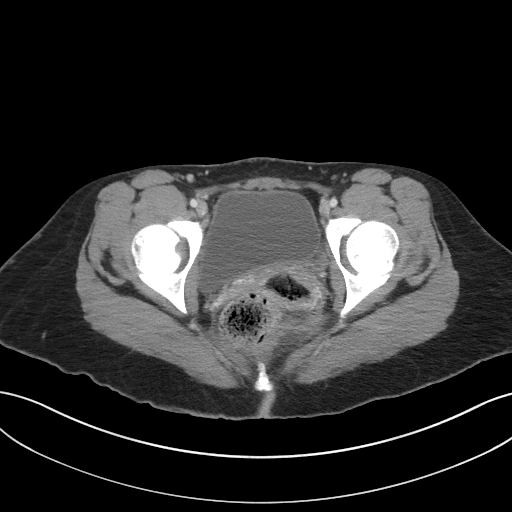
[im 25/89  soft-tissue]
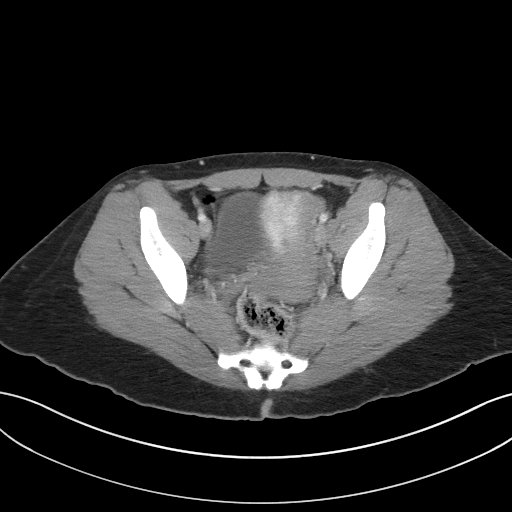
[im 32/89  soft-tissue]
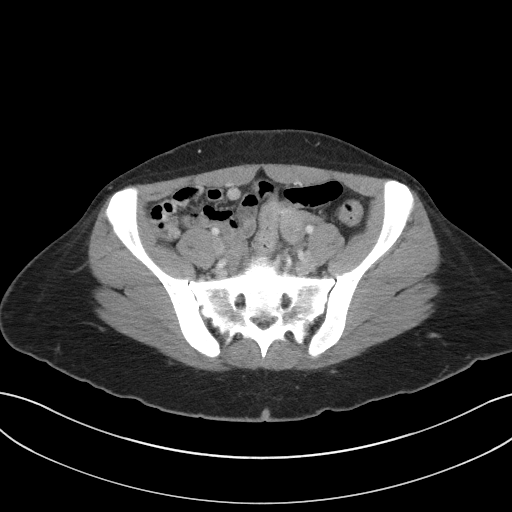
[im 39/89  soft-tissue]
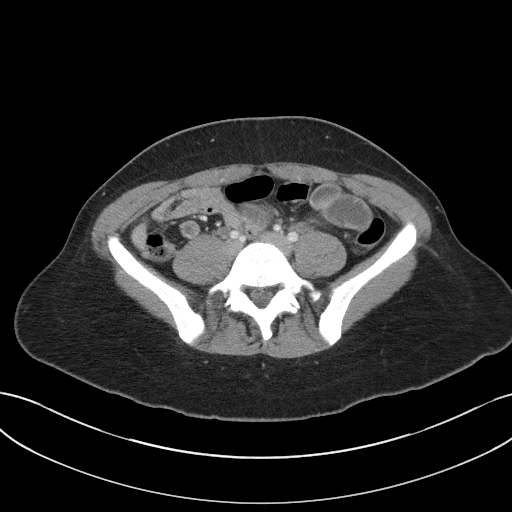
[im 46/89  soft-tissue]
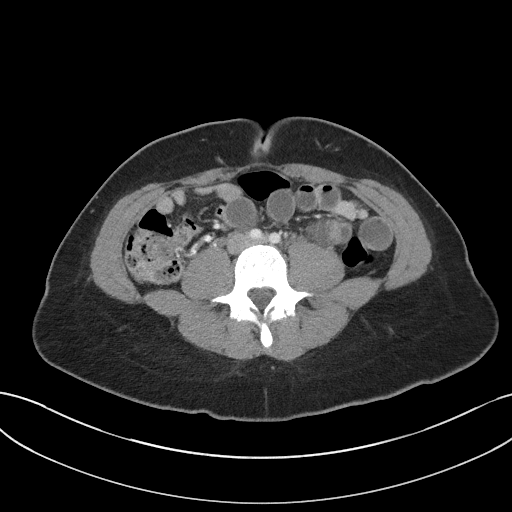
[im 50/89  soft-tissue]
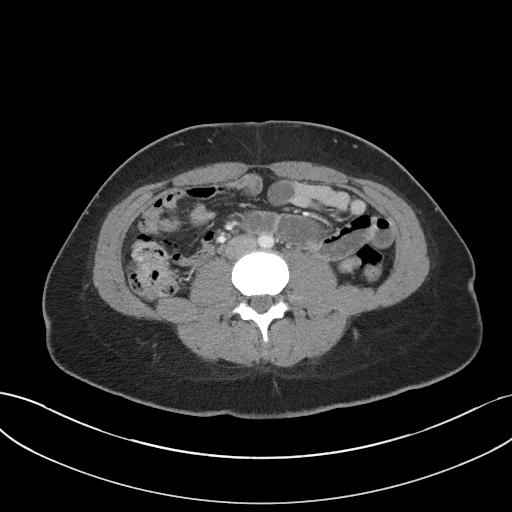
[im 57/89  soft-tissue]
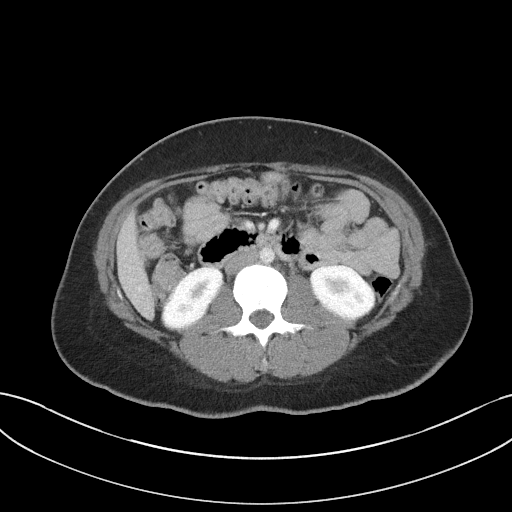
[im 57/89  bone]
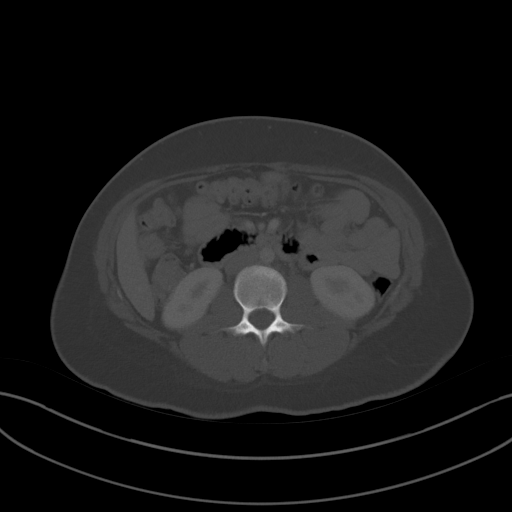
[im 64/89  soft-tissue]
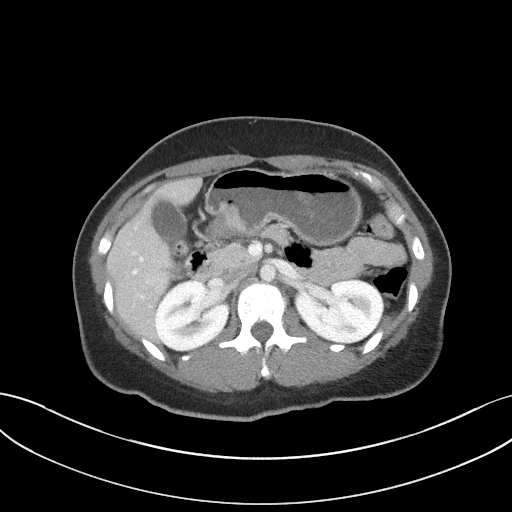
[im 71/89  soft-tissue]
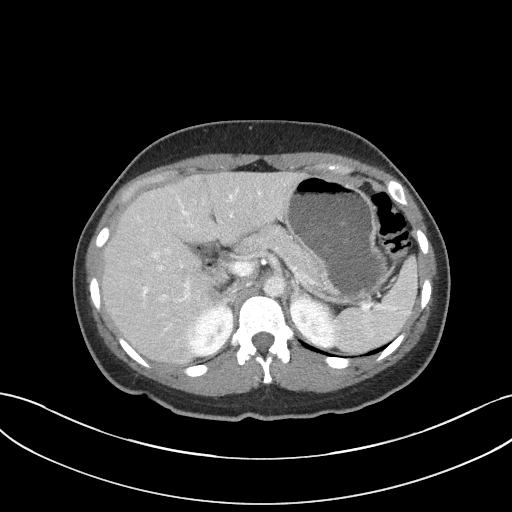
[im 78/89  soft-tissue]
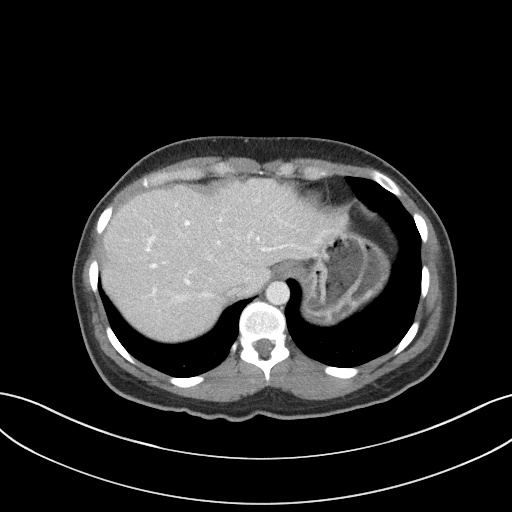
[im 85/89  soft-tissue]
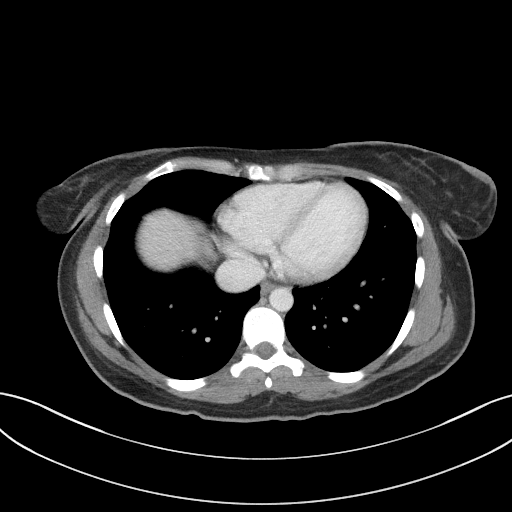

[Series 5: coronal st · coronal · 0.66mm/px · 3 of 74 slices shown]
[im 25/74  soft-tissue]
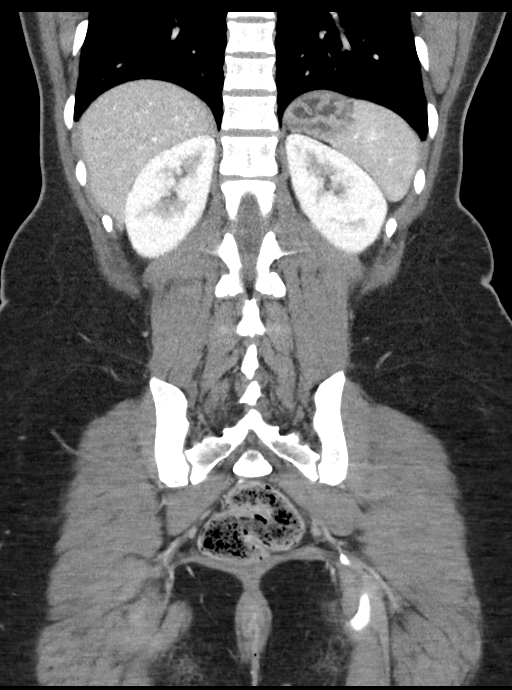
[im 33/74  soft-tissue]
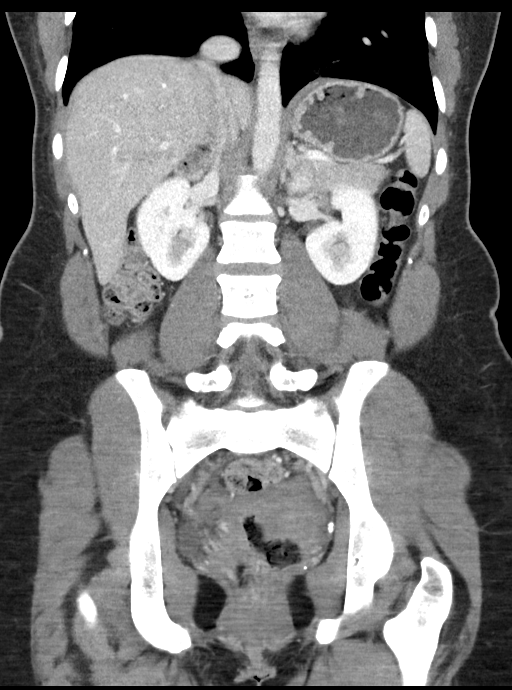
[im 41/74  soft-tissue]
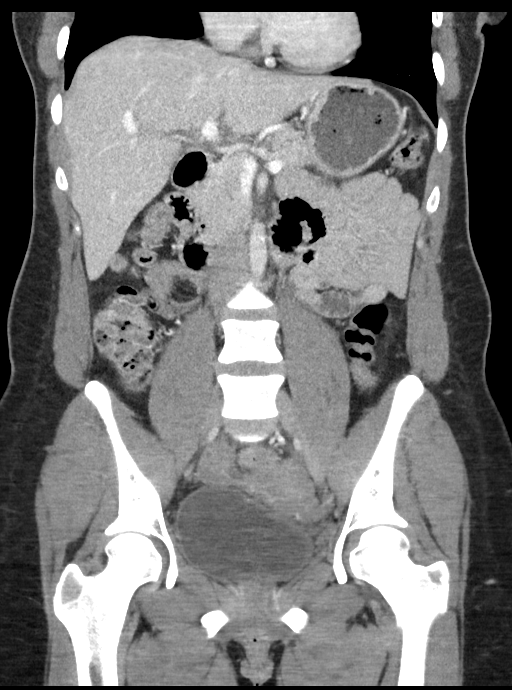

[16 of 46 positions shown; findings below may reference images not displayed]

FINDINGS: Lower chest: The lung bases are clear. The heart size is normal.

Hepatobiliary: The liver is normal. Cholelithiasis without acute
inflammation.There is no biliary ductal dilation.

Pancreas: Normal contours without ductal dilatation. No
peripancreatic fluid collection.

Spleen: No splenic laceration or hematoma.

Adrenals/Urinary Tract:

--Adrenal glands: No adrenal hemorrhage.

--Right kidney/ureter: No hydronephrosis or perinephric hematoma.

--Left kidney/ureter: No hydronephrosis or perinephric hematoma.

--Urinary bladder: Unremarkable.

Stomach/Bowel:

--Stomach/Duodenum: No hiatal hernia or other gastric abnormality.
Normal duodenal course and caliber.

--Small bowel: There are few mildly dilated fluid-filled loops of
small bowel in the left lower quadrant without evidence for distinct
transition point.

--Colon: No focal abnormality.

--Appendix: Normal.

Vascular/Lymphatic: Normal course and caliber of the major abdominal
vessels.

--No retroperitoneal lymphadenopathy.

--No mesenteric lymphadenopathy.

--No pelvic or inguinal lymphadenopathy.

Reproductive: Unremarkable

Other: No ascites or free air. The abdominal wall is normal.

Musculoskeletal. No acute displaced fractures.
IMPRESSION: 1. There are few mildly dilated fluid-filled loops of small bowel in
the left lower quadrant without evidence for distinct transition
point. These findings may be secondary to an enteritis.
2. Cholelithiasis without acute inflammation.

## 2021-01-22 ENCOUNTER — Ambulatory Visit (HOSPITAL_COMMUNITY)
Admission: EM | Admit: 2021-01-22 | Discharge: 2021-01-22 | Discharge status: Against Medical Advice | Payer: Medicaid Other

## 2021-01-22 NOTE — ED Triage Notes (Signed)
No answer on phone or lobby

## 2021-01-22 NOTE — ED Triage Notes (Signed)
No answer on phone or from lobby  

## 2022-07-31 ENCOUNTER — Other Ambulatory Visit: Payer: Self-pay

## 2022-07-31 ENCOUNTER — Emergency Department (HOSPITAL_COMMUNITY)
Admission: EM | Admit: 2022-07-31 | Discharge: 2022-07-31 | Disposition: A | Discharge status: Home / Self Care | Payer: Medicaid Other | Attending: Emergency Medicine | Admitting: Emergency Medicine

## 2022-07-31 DIAGNOSIS — K029 Dental caries, unspecified: Secondary | ICD-10-CM | POA: Insufficient documentation

## 2022-07-31 DIAGNOSIS — F17200 Nicotine dependence, unspecified, uncomplicated: Secondary | ICD-10-CM | POA: Insufficient documentation

## 2022-07-31 DIAGNOSIS — K0889 Other specified disorders of teeth and supporting structures: Secondary | ICD-10-CM | POA: Diagnosis present

## 2022-07-31 MED ORDER — AMOXICILLIN-POT CLAVULANATE 875-125 MG PO TABS: 1.0000 | ORAL_TABLET | Freq: Two times a day (BID) | ORAL | 0 refills | Status: AC

## 2022-07-31 NOTE — ED Provider Notes (Signed)
[place] EMERGENCY DEPARTMENT AT [facility] Provider Note   CSN: [id number] Arrival date & time: 07/31/22  1439     History  Chief Complaint  Patient presents with   Dental Pain    Megan Walters is a 32 y.o. female.  32 y.o female with past medical history of tobacco use, marijuana use presents to the ED with a chief complaint of right upper dental, along with left lower dental pain has been ongoing for the past 2 months.  She reports now she is noticed that her right tooth has now broken off and is now exposed.  She reports worsening pain with mastication, along with opening and closing of her mouth.  She has been taking Tylenol without much improvement in symptoms.  She did call the dentist in order to obtain an appointment, state the appointment is not for another month. No other complaints reported.   The history is provided by the patient.  Dental Pain Associated symptoms: no fever        Home Medications Prior to Admission medications   Medication Sig Start Date End Date Taking? Authorizing Provider  amoxicillin-clavulanate (AUGMENTIN) 875-125 MG tablet Take 1 tablet by mouth every 12 (twelve) hours for 7 days. 07/31/22 08/07/22 Yes Jazzelle Zhang, PA-C  dicyclomine (BENTYL) 20 MG tablet Take 1 tablet (20 mg total) by mouth 2 (two) times daily. 01/24/19   Henderly, Britni A, PA-C  fluticasone (FLONASE) 50 MCG/ACT nasal spray Place 1 spray into both nostrils daily. 06/16/16   Fawze, Mina A, PA-C  HYDROcodone-acetaminophen (NORCO/VICODIN) 5-325 MG tablet Take 1 tablet by mouth every 4 (four) hours as needed. 11/29/19   Jeannie Fend, PA-C  lidocaine (LIDODERM) 5 % Place 1 patch onto the skin daily. Remove & Discard patch within 12 hours or as directed by MD 11/29/19   Jeannie Fend, PA-C  orphenadrine (NORFLEX) 100 MG tablet Take 1 tablet (100 mg total) by mouth 2 (two) times daily. 11/29/19   Jeannie Fend, PA-C  potassium chloride SA (K-DUR) 20 MEQ tablet One  tablet po bid x 3 days, then one tablet once a day 09/07/18   Cathren Laine, MD      Allergies    Ibuprofen    Review of Systems   Review of Systems  Constitutional:  Negative for fever.  HENT:  Positive for dental problem.     Physical Exam Updated Vital Signs BP 111/87 (BP Location: Right Arm)   Pulse 76   Temp 98.3 F (36.8 C) (Oral)   Resp 18   LMP 07/24/2022 (Exact Date)   SpO2 100%  Physical Exam Vitals and nursing note reviewed.  Constitutional:      Appearance: Normal appearance.  HENT:     Head: Normocephalic and atraumatic.     Mouth/Throat:     Mouth: Mucous membranes are moist.     Dentition: Abnormal dentition. Dental tenderness, dental caries and dental abscesses present. No gingival swelling.   Eyes:     Pupils: Pupils are equal, round, and reactive to light.  Cardiovascular:     Rate and Rhythm: Normal rate.  Pulmonary:     Effort: Pulmonary effort is normal.  Abdominal:     General: Abdomen is flat.  Musculoskeletal:     Cervical back: Normal range of motion and neck supple.  Skin:    General: Skin is warm and dry.  Neurological:     Mental Status: She is alert and oriented to person, place, and time.  ED Results / Procedures / Treatments   Labs (all labs ordered are listed, but only abnormal results are displayed) Labs Reviewed - No data to display  EKG None  Radiology No results found.  Procedures Procedures    Medications Ordered in ED Medications - No data to display  ED Course/ Medical Decision Making/ A&P                             Medical Decision Making   Patient presents to the ED with dental infection that been ongoing for the past 2 months, they have been worsening, as now her teeth have chipped off.  She has not been experiencing any systemic signs, however continues to endorse pain specially with mastication along with movement of her jaw.  She is afebrile on arrival, has good EOMs, no facial swelling noted, no  fever.  Localized abscess noted to the back of her mouth.  She does endorse tobacco use.  We discussed tobacco cessation, does have an appointment with the dentist in the upcoming month.  Do not feel that infection is systemic at this time, will place her on a short course of Augmentin to help with treatment.  Patient is hemodynamically stable for discharge.   Portions of this note were generated with Scientist, clinical (histocompatibility and immunogenetics). Dictation errors may occur despite best attempts at proofreading.   Final Clinical Impression(s) / ED Diagnoses Final diagnoses:  Pain, dental    Rx / DC Orders ED Discharge Orders          Ordered    amoxicillin-clavulanate (AUGMENTIN) 875-125 MG tablet  Every 12 hours        07/31/22 1605              Claude Manges, PA-C 07/31/22 1606    Rolan Bucco, MD 07/31/22 2322

## 2022-07-31 NOTE — Discharge Instructions (Addendum)
I have prescribed antibiotics to help treat your infection, please take 1 tablet twice a day for the next 7 days.  If you experience any worsening symptoms, facial swelling, fever please return to the emergency department.

## 2022-07-31 NOTE — ED Triage Notes (Signed)
Patient reports two months of dental pain-R upper and lower and L lower. States the tooth is breaking off. Taking OTC meds with minimal relief and unable to get an appointment with a dentist in the near future.
